# Patient Record
Sex: Female | Born: 1965
Health system: Southern US, Community
[De-identification: ages and names within clinical notes are randomized; demographics above are authoritative.]

## PROBLEM LIST (undated history)

## (undated) DIAGNOSIS — M1990 Unspecified osteoarthritis, unspecified site: Secondary | ICD-10-CM

## (undated) DIAGNOSIS — F32A Depression, unspecified: Secondary | ICD-10-CM

## (undated) HISTORY — PX: COLONOSCOPY: SHX174

---

## 1997-03-13 ENCOUNTER — Encounter: Admission: RE | Admit: 1997-03-13 | Discharge: 1997-06-11 | Payer: Self-pay | Admitting: Obstetrics and Gynecology

## 1998-06-21 ENCOUNTER — Other Ambulatory Visit: Admission: RE | Admit: 1998-06-21 | Discharge: 1998-06-21 | Payer: Self-pay | Admitting: *Deleted

## 1999-01-17 ENCOUNTER — Inpatient Hospital Stay (HOSPITAL_COMMUNITY): Admission: AD | Admit: 1999-01-17 | Discharge: 1999-01-20 | Payer: Self-pay | Admitting: *Deleted

## 1999-02-27 ENCOUNTER — Other Ambulatory Visit: Admission: RE | Admit: 1999-02-27 | Discharge: 1999-02-27 | Payer: Self-pay | Admitting: *Deleted

## 1999-07-10 ENCOUNTER — Encounter: Admission: RE | Admit: 1999-07-10 | Discharge: 1999-07-10 | Payer: Self-pay | Admitting: Gastroenterology

## 1999-07-10 ENCOUNTER — Encounter: Payer: Self-pay | Admitting: Gastroenterology

## 1999-07-12 ENCOUNTER — Encounter: Admission: RE | Admit: 1999-07-12 | Discharge: 1999-07-12 | Payer: Self-pay | Admitting: Gastroenterology

## 1999-07-12 ENCOUNTER — Encounter: Payer: Self-pay | Admitting: Gastroenterology

## 1999-11-21 ENCOUNTER — Ambulatory Visit (HOSPITAL_COMMUNITY): Admission: RE | Admit: 1999-11-21 | Discharge: 1999-11-21 | Payer: Self-pay | Admitting: Gastroenterology

## 2000-05-20 ENCOUNTER — Other Ambulatory Visit: Admission: RE | Admit: 2000-05-20 | Discharge: 2000-05-20 | Payer: Self-pay | Admitting: *Deleted

## 2002-02-16 ENCOUNTER — Other Ambulatory Visit: Admission: RE | Admit: 2002-02-16 | Discharge: 2002-02-16 | Payer: Self-pay | Admitting: *Deleted

## 2003-01-11 ENCOUNTER — Ambulatory Visit (HOSPITAL_COMMUNITY): Admission: RE | Admit: 2003-01-11 | Discharge: 2003-01-11 | Payer: Self-pay | Admitting: *Deleted

## 2003-08-08 ENCOUNTER — Other Ambulatory Visit: Admission: RE | Admit: 2003-08-08 | Discharge: 2003-08-08 | Payer: Self-pay | Admitting: Obstetrics and Gynecology

## 2005-12-17 ENCOUNTER — Ambulatory Visit (HOSPITAL_COMMUNITY): Admission: RE | Admit: 2005-12-17 | Discharge: 2005-12-17 | Payer: Self-pay | Admitting: Gastroenterology

## 2010-04-10 ENCOUNTER — Other Ambulatory Visit: Payer: Self-pay | Admitting: Gastroenterology

## 2011-06-27 ENCOUNTER — Other Ambulatory Visit: Payer: Self-pay | Admitting: Obstetrics and Gynecology

## 2011-06-27 DIAGNOSIS — R928 Other abnormal and inconclusive findings on diagnostic imaging of breast: Secondary | ICD-10-CM

## 2011-07-17 ENCOUNTER — Ambulatory Visit
Admission: RE | Admit: 2011-07-17 | Discharge: 2011-07-17 | Disposition: A | Discharge status: Home / Self Care | Payer: 59 | Source: Ambulatory Visit | Attending: Obstetrics and Gynecology | Admitting: Obstetrics and Gynecology

## 2011-07-17 ENCOUNTER — Other Ambulatory Visit: Payer: Self-pay | Admitting: Obstetrics and Gynecology

## 2011-07-17 DIAGNOSIS — R928 Other abnormal and inconclusive findings on diagnostic imaging of breast: Secondary | ICD-10-CM

## 2011-07-31 ENCOUNTER — Ambulatory Visit
Admission: RE | Admit: 2011-07-31 | Discharge: 2011-07-31 | Disposition: A | Discharge status: Home / Self Care | Payer: 59 | Source: Ambulatory Visit | Attending: Obstetrics and Gynecology | Admitting: Obstetrics and Gynecology

## 2011-07-31 ENCOUNTER — Other Ambulatory Visit: Payer: Self-pay | Admitting: Obstetrics and Gynecology

## 2011-07-31 DIAGNOSIS — R928 Other abnormal and inconclusive findings on diagnostic imaging of breast: Secondary | ICD-10-CM

## 2012-03-13 DEATH — deceased

## 2012-10-06 ENCOUNTER — Other Ambulatory Visit: Payer: Self-pay

## 2012-10-06 DIAGNOSIS — Z1231 Encounter for screening mammogram for malignant neoplasm of breast: Secondary | ICD-10-CM

## 2012-11-03 ENCOUNTER — Ambulatory Visit
Admission: RE | Admit: 2012-11-03 | Discharge: 2012-11-03 | Disposition: A | Discharge status: Home / Self Care | Payer: 59 | Source: Ambulatory Visit

## 2012-11-03 DIAGNOSIS — Z1231 Encounter for screening mammogram for malignant neoplasm of breast: Secondary | ICD-10-CM

## 2012-11-09 ENCOUNTER — Other Ambulatory Visit: Payer: Self-pay | Admitting: Internal Medicine

## 2012-11-09 DIAGNOSIS — R928 Other abnormal and inconclusive findings on diagnostic imaging of breast: Secondary | ICD-10-CM

## 2012-11-24 ENCOUNTER — Ambulatory Visit
Admission: RE | Admit: 2012-11-24 | Discharge: 2012-11-24 | Disposition: A | Discharge status: Home / Self Care | Payer: 59 | Source: Ambulatory Visit | Attending: Internal Medicine | Admitting: Internal Medicine

## 2012-11-24 DIAGNOSIS — R928 Other abnormal and inconclusive findings on diagnostic imaging of breast: Secondary | ICD-10-CM

## 2013-11-10 ENCOUNTER — Other Ambulatory Visit: Payer: Self-pay

## 2013-11-10 DIAGNOSIS — Z1231 Encounter for screening mammogram for malignant neoplasm of breast: Secondary | ICD-10-CM

## 2013-11-14 ENCOUNTER — Ambulatory Visit: Payer: 59

## 2013-11-16 ENCOUNTER — Inpatient Hospital Stay: Admission: RE | Admit: 2013-11-16 | Payer: 59 | Source: Ambulatory Visit

## 2013-11-23 ENCOUNTER — Ambulatory Visit
Admission: RE | Admit: 2013-11-23 | Discharge: 2013-11-23 | Disposition: A | Discharge status: Home / Self Care | Payer: 59 | Source: Ambulatory Visit

## 2013-11-23 DIAGNOSIS — Z1231 Encounter for screening mammogram for malignant neoplasm of breast: Secondary | ICD-10-CM

## 2014-11-01 ENCOUNTER — Other Ambulatory Visit: Payer: Self-pay

## 2014-11-01 DIAGNOSIS — Z1231 Encounter for screening mammogram for malignant neoplasm of breast: Secondary | ICD-10-CM

## 2014-12-08 ENCOUNTER — Ambulatory Visit
Admission: RE | Admit: 2014-12-08 | Discharge: 2014-12-08 | Disposition: A | Discharge status: Home / Self Care | Payer: 59 | Source: Ambulatory Visit

## 2014-12-08 DIAGNOSIS — Z1231 Encounter for screening mammogram for malignant neoplasm of breast: Secondary | ICD-10-CM

## 2015-03-05 MED FILL — ESCITALOPRAM 10 MG TABLET: 10 | 30 days supply | Qty: 30 | Fill #6

## 2015-04-12 MED FILL — ESCITALOPRAM 10 MG TABLET: 10 | 30 days supply | Qty: 30 | Fill #7

## 2015-04-23 DIAGNOSIS — E784 Other hyperlipidemia: Secondary | ICD-10-CM | POA: Diagnosis not present

## 2015-04-23 DIAGNOSIS — Z0000 Encounter for general adult medical examination without abnormal findings: Secondary | ICD-10-CM | POA: Diagnosis not present

## 2015-06-13 DIAGNOSIS — E784 Other hyperlipidemia: Secondary | ICD-10-CM | POA: Diagnosis not present

## 2015-06-13 DIAGNOSIS — I868 Varicose veins of other specified sites: Secondary | ICD-10-CM | POA: Diagnosis not present

## 2015-06-13 DIAGNOSIS — F329 Major depressive disorder, single episode, unspecified: Secondary | ICD-10-CM | POA: Diagnosis not present

## 2015-06-13 DIAGNOSIS — I7300 Raynaud's syndrome without gangrene: Secondary | ICD-10-CM | POA: Diagnosis not present

## 2015-06-13 DIAGNOSIS — D126 Benign neoplasm of colon, unspecified: Secondary | ICD-10-CM | POA: Diagnosis not present

## 2015-06-13 DIAGNOSIS — Z8249 Family history of ischemic heart disease and other diseases of the circulatory system: Secondary | ICD-10-CM | POA: Diagnosis not present

## 2015-06-13 DIAGNOSIS — Z0000 Encounter for general adult medical examination without abnormal findings: Secondary | ICD-10-CM | POA: Diagnosis not present

## 2015-06-13 DIAGNOSIS — R0789 Other chest pain: Secondary | ICD-10-CM | POA: Diagnosis not present

## 2015-06-13 DIAGNOSIS — M25552 Pain in left hip: Secondary | ICD-10-CM | POA: Diagnosis not present

## 2015-06-13 MED FILL — FLUTICASONE PROP 50 MCG SPR: 50 | 90 days supply | Qty: 48 | Fill #0

## 2015-06-13 MED FILL — valACYclovir HCL 1 GM TABS: 1 | 5 days supply | Qty: 20 | Fill #0

## 2015-06-13 MED FILL — ROSUVASTATIN CALCIUM 10 MG: 10 | 90 days supply | Qty: 90 | Fill #0

## 2015-06-14 MED FILL — ESCITALOPRAM 10 MG TABLET: 10 | 30 days supply | Qty: 30 | Fill #0

## 2015-07-27 MED FILL — ESCITALOPRAM 10 MG TABLET: 10 | 90 days supply | Qty: 90 | Fill #0

## 2015-11-16 MED FILL — ESCITALOPRAM 10 MG TABLET: 10 | 90 days supply | Qty: 90 | Fill #1

## 2015-11-16 MED FILL — ROSUVASTATIN CALCIUM 10 MG: 10 | 90 days supply | Qty: 90 | Fill #1

## 2015-11-16 MED FILL — FLUTICASONE PROP 50 MCG SPR: 50 | 90 days supply | Qty: 48 | Fill #1

## 2016-03-20 DIAGNOSIS — Z01419 Encounter for gynecological examination (general) (routine) without abnormal findings: Secondary | ICD-10-CM | POA: Diagnosis not present

## 2016-03-20 DIAGNOSIS — Z6822 Body mass index (BMI) 22.0-22.9, adult: Secondary | ICD-10-CM | POA: Diagnosis not present

## 2016-05-27 MED FILL — valACYclovir HCL 1 GM TABS: 1 | 5 days supply | Qty: 20 | Fill #1

## 2016-05-27 MED FILL — ROSUVASTATIN CALCIUM 10 MG: 10 | 90 days supply | Qty: 90 | Fill #2

## 2016-05-27 MED FILL — ESCITALOPRAM 10 MG TABLET: 10 | 90 days supply | Qty: 90 | Fill #2

## 2016-05-27 MED FILL — FLUTICASONE PROP 50 MCG SPR: 50 | 90 days supply | Qty: 48 | Fill #2

## 2016-06-03 DIAGNOSIS — D1801 Hemangioma of skin and subcutaneous tissue: Secondary | ICD-10-CM | POA: Diagnosis not present

## 2016-06-03 DIAGNOSIS — L814 Other melanin hyperpigmentation: Secondary | ICD-10-CM | POA: Diagnosis not present

## 2016-06-03 DIAGNOSIS — L720 Epidermal cyst: Secondary | ICD-10-CM | POA: Diagnosis not present

## 2016-06-03 DIAGNOSIS — L438 Other lichen planus: Secondary | ICD-10-CM | POA: Diagnosis not present

## 2016-06-03 DIAGNOSIS — L821 Other seborrheic keratosis: Secondary | ICD-10-CM | POA: Diagnosis not present

## 2016-06-27 DIAGNOSIS — Z0000 Encounter for general adult medical examination without abnormal findings: Secondary | ICD-10-CM | POA: Diagnosis not present

## 2016-07-03 DIAGNOSIS — R0789 Other chest pain: Secondary | ICD-10-CM | POA: Diagnosis not present

## 2016-07-03 DIAGNOSIS — E784 Other hyperlipidemia: Secondary | ICD-10-CM | POA: Diagnosis not present

## 2016-07-03 DIAGNOSIS — Z0000 Encounter for general adult medical examination without abnormal findings: Secondary | ICD-10-CM | POA: Diagnosis not present

## 2016-07-03 DIAGNOSIS — N390 Urinary tract infection, site not specified: Secondary | ICD-10-CM | POA: Diagnosis not present

## 2016-07-03 DIAGNOSIS — I7300 Raynaud's syndrome without gangrene: Secondary | ICD-10-CM | POA: Diagnosis not present

## 2016-07-03 DIAGNOSIS — Z23 Encounter for immunization: Secondary | ICD-10-CM | POA: Diagnosis not present

## 2016-07-03 DIAGNOSIS — Z1212 Encounter for screening for malignant neoplasm of rectum: Secondary | ICD-10-CM | POA: Diagnosis not present

## 2016-07-03 DIAGNOSIS — B009 Herpesviral infection, unspecified: Secondary | ICD-10-CM | POA: Diagnosis not present

## 2016-07-03 DIAGNOSIS — R42 Dizziness and giddiness: Secondary | ICD-10-CM | POA: Diagnosis not present

## 2016-07-03 DIAGNOSIS — M25552 Pain in left hip: Secondary | ICD-10-CM | POA: Diagnosis not present

## 2016-07-03 DIAGNOSIS — Z1389 Encounter for screening for other disorder: Secondary | ICD-10-CM | POA: Diagnosis not present

## 2016-07-03 DIAGNOSIS — I868 Varicose veins of other specified sites: Secondary | ICD-10-CM | POA: Diagnosis not present

## 2016-09-17 DIAGNOSIS — R300 Dysuria: Secondary | ICD-10-CM | POA: Diagnosis not present

## 2016-09-17 DIAGNOSIS — N390 Urinary tract infection, site not specified: Secondary | ICD-10-CM | POA: Diagnosis not present

## 2016-09-17 MED FILL — CEFDINIR 300 MG CAPSULE: 300 | 7 days supply | Qty: 14 | Fill #0

## 2016-10-28 MED FILL — FLUTICASONE PROP 50 MCG SPR: 50 | 90 days supply | Qty: 48 | Fill #0

## 2016-10-28 MED FILL — ESCITALOPRAM 10 MG TABLET: 10 | 90 days supply | Qty: 90 | Fill #0

## 2016-10-28 MED FILL — ROSUVASTATIN CALCIUM 10 MG: 10 | 90 days supply | Qty: 90 | Fill #0

## 2016-12-24 DIAGNOSIS — H524 Presbyopia: Secondary | ICD-10-CM | POA: Diagnosis not present

## 2017-07-07 MED FILL — TRIAMCINOLONE 0.1% CREAM: 0.1 | 15 days supply | Qty: 80 | Fill #0

## 2017-07-20 ENCOUNTER — Other Ambulatory Visit: Payer: Self-pay | Admitting: Internal Medicine

## 2017-07-20 DIAGNOSIS — Z1231 Encounter for screening mammogram for malignant neoplasm of breast: Secondary | ICD-10-CM

## 2017-08-10 ENCOUNTER — Ambulatory Visit: Payer: 59

## 2017-08-10 ENCOUNTER — Ambulatory Visit
Admission: RE | Admit: 2017-08-10 | Discharge: 2017-08-10 | Disposition: A | Discharge status: Home / Self Care | Payer: 59 | Source: Ambulatory Visit | Attending: Internal Medicine | Admitting: Internal Medicine

## 2017-08-10 DIAGNOSIS — Z1231 Encounter for screening mammogram for malignant neoplasm of breast: Secondary | ICD-10-CM | POA: Diagnosis not present

## 2017-09-16 MED FILL — SHINGRIX 50 MCG SUS: 50 | 1 days supply | Qty: 1 | Fill #0

## 2017-10-05 DIAGNOSIS — Z86010 Personal history of colonic polyps: Secondary | ICD-10-CM | POA: Diagnosis not present

## 2017-10-05 DIAGNOSIS — R140 Abdominal distension (gaseous): Secondary | ICD-10-CM | POA: Diagnosis not present

## 2017-10-05 DIAGNOSIS — Z800 Family history of malignant neoplasm of digestive organs: Secondary | ICD-10-CM | POA: Diagnosis not present

## 2017-10-14 DIAGNOSIS — Z01419 Encounter for gynecological examination (general) (routine) without abnormal findings: Secondary | ICD-10-CM | POA: Diagnosis not present

## 2017-10-14 DIAGNOSIS — Z6822 Body mass index (BMI) 22.0-22.9, adult: Secondary | ICD-10-CM | POA: Diagnosis not present

## 2017-11-11 MED FILL — PEG-3350 SOLUTION: 420 | 1 days supply | Qty: 4000 | Fill #0

## 2017-11-13 DIAGNOSIS — Z86010 Personal history of colonic polyps: Secondary | ICD-10-CM | POA: Diagnosis not present

## 2017-11-13 DIAGNOSIS — Z800 Family history of malignant neoplasm of digestive organs: Secondary | ICD-10-CM | POA: Diagnosis not present

## 2017-12-10 MED FILL — SHINGRIX 50 MCG SUS: 50 | 1 days supply | Qty: 1 | Fill #1

## 2018-02-18 DIAGNOSIS — Z0000 Encounter for general adult medical examination without abnormal findings: Secondary | ICD-10-CM | POA: Diagnosis not present

## 2018-02-18 DIAGNOSIS — E7849 Other hyperlipidemia: Secondary | ICD-10-CM | POA: Diagnosis not present

## 2018-02-18 DIAGNOSIS — R82998 Other abnormal findings in urine: Secondary | ICD-10-CM | POA: Diagnosis not present

## 2018-02-24 ENCOUNTER — Other Ambulatory Visit: Payer: Self-pay | Admitting: Internal Medicine

## 2018-02-24 DIAGNOSIS — M25552 Pain in left hip: Secondary | ICD-10-CM | POA: Diagnosis not present

## 2018-02-24 DIAGNOSIS — N951 Menopausal and female climacteric states: Secondary | ICD-10-CM | POA: Diagnosis not present

## 2018-02-24 DIAGNOSIS — Z1389 Encounter for screening for other disorder: Secondary | ICD-10-CM | POA: Diagnosis not present

## 2018-02-24 DIAGNOSIS — E7849 Other hyperlipidemia: Secondary | ICD-10-CM | POA: Diagnosis not present

## 2018-02-24 DIAGNOSIS — Z1231 Encounter for screening mammogram for malignant neoplasm of breast: Secondary | ICD-10-CM

## 2018-02-24 DIAGNOSIS — I7300 Raynaud's syndrome without gangrene: Secondary | ICD-10-CM | POA: Diagnosis not present

## 2018-02-24 DIAGNOSIS — Z0000 Encounter for general adult medical examination without abnormal findings: Secondary | ICD-10-CM | POA: Diagnosis not present

## 2018-02-24 DIAGNOSIS — B009 Herpesviral infection, unspecified: Secondary | ICD-10-CM | POA: Diagnosis not present

## 2018-02-24 DIAGNOSIS — R200 Anesthesia of skin: Secondary | ICD-10-CM | POA: Diagnosis not present

## 2018-02-24 DIAGNOSIS — I868 Varicose veins of other specified sites: Secondary | ICD-10-CM | POA: Diagnosis not present

## 2018-02-24 DIAGNOSIS — D126 Benign neoplasm of colon, unspecified: Secondary | ICD-10-CM | POA: Diagnosis not present

## 2018-02-24 DIAGNOSIS — Z1331 Encounter for screening for depression: Secondary | ICD-10-CM | POA: Diagnosis not present

## 2018-03-01 ENCOUNTER — Other Ambulatory Visit: Payer: Self-pay | Admitting: Internal Medicine

## 2018-03-01 DIAGNOSIS — E785 Hyperlipidemia, unspecified: Secondary | ICD-10-CM

## 2018-03-25 DIAGNOSIS — H5213 Myopia, bilateral: Secondary | ICD-10-CM | POA: Diagnosis not present

## 2018-04-22 MED FILL — valACYclovir HCL 1 GM TABS: 1 | 7 days supply | Qty: 20 | Fill #0

## 2018-08-16 ENCOUNTER — Other Ambulatory Visit: Payer: Self-pay | Admitting: Internal Medicine

## 2018-08-16 DIAGNOSIS — E785 Hyperlipidemia, unspecified: Secondary | ICD-10-CM

## 2018-08-17 ENCOUNTER — Ambulatory Visit
Admission: RE | Admit: 2018-08-17 | Discharge: 2018-08-17 | Disposition: A | Discharge status: Home / Self Care | Payer: 59 | Source: Ambulatory Visit | Attending: Internal Medicine | Admitting: Internal Medicine

## 2018-08-17 DIAGNOSIS — E785 Hyperlipidemia, unspecified: Secondary | ICD-10-CM

## 2018-08-19 MED FILL — EZETIMIBE 10 MG TABS: 10 | 90 days supply | Qty: 90 | Fill #0

## 2018-08-23 ENCOUNTER — Other Ambulatory Visit: Payer: Self-pay

## 2018-08-23 ENCOUNTER — Ambulatory Visit
Admission: RE | Admit: 2018-08-23 | Discharge: 2018-08-23 | Disposition: A | Discharge status: Home / Self Care | Payer: 59 | Source: Ambulatory Visit | Attending: Internal Medicine | Admitting: Internal Medicine

## 2018-08-23 DIAGNOSIS — Z1231 Encounter for screening mammogram for malignant neoplasm of breast: Secondary | ICD-10-CM

## 2018-11-22 MED FILL — EZETIMIBE 10 MG TABS: 10 | 90 days supply | Qty: 90 | Fill #1

## 2019-01-11 DIAGNOSIS — Z01419 Encounter for gynecological examination (general) (routine) without abnormal findings: Secondary | ICD-10-CM | POA: Diagnosis not present

## 2019-01-11 DIAGNOSIS — Z6822 Body mass index (BMI) 22.0-22.9, adult: Secondary | ICD-10-CM | POA: Diagnosis not present

## 2019-02-02 MED FILL — BUPROPION HCL XL 150 MG TAB: 150 | 30 days supply | Qty: 30 | Fill #0

## 2019-02-02 MED FILL — ESCITALOPRAM 10 MG TABLET: 10 | 90 days supply | Qty: 90 | Fill #0

## 2019-02-02 MED FILL — INTRAROSA 6.5 MG VAG INSERT: 6.5 | 28 days supply | Qty: 28 | Fill #0

## 2019-03-24 MED FILL — buPROPion HCL ER (XL) 150 M: 150 | 90 days supply | Qty: 90 | Fill #0

## 2019-03-24 MED FILL — ESCITALOPRAM 10 MG TABLET: 10 | 90 days supply | Qty: 90 | Fill #0

## 2019-03-28 DIAGNOSIS — Z0000 Encounter for general adult medical examination without abnormal findings: Secondary | ICD-10-CM | POA: Diagnosis not present

## 2019-03-28 DIAGNOSIS — E7849 Other hyperlipidemia: Secondary | ICD-10-CM | POA: Diagnosis not present

## 2019-03-31 MED FILL — EZETIMIBE 10 MG TABS: 10 | 90 days supply | Qty: 90 | Fill #0

## 2019-04-04 DIAGNOSIS — Z1331 Encounter for screening for depression: Secondary | ICD-10-CM | POA: Diagnosis not present

## 2019-04-04 DIAGNOSIS — E785 Hyperlipidemia, unspecified: Secondary | ICD-10-CM | POA: Diagnosis not present

## 2019-04-04 DIAGNOSIS — D126 Benign neoplasm of colon, unspecified: Secondary | ICD-10-CM | POA: Diagnosis not present

## 2019-04-04 DIAGNOSIS — I7300 Raynaud's syndrome without gangrene: Secondary | ICD-10-CM | POA: Diagnosis not present

## 2019-04-04 DIAGNOSIS — Z8249 Family history of ischemic heart disease and other diseases of the circulatory system: Secondary | ICD-10-CM | POA: Diagnosis not present

## 2019-04-04 DIAGNOSIS — F329 Major depressive disorder, single episode, unspecified: Secondary | ICD-10-CM | POA: Diagnosis not present

## 2019-04-04 DIAGNOSIS — M25552 Pain in left hip: Secondary | ICD-10-CM | POA: Diagnosis not present

## 2019-04-04 DIAGNOSIS — N951 Menopausal and female climacteric states: Secondary | ICD-10-CM | POA: Diagnosis not present

## 2019-04-04 DIAGNOSIS — Z0000 Encounter for general adult medical examination without abnormal findings: Secondary | ICD-10-CM | POA: Diagnosis not present

## 2019-04-04 DIAGNOSIS — I700 Atherosclerosis of aorta: Secondary | ICD-10-CM | POA: Diagnosis not present

## 2019-04-04 MED FILL — valACYclovir HCL 1 GM TABS: 1 | 7 days supply | Qty: 20 | Fill #1

## 2019-04-08 MED FILL — EZETIMIBE 10 MG TABS: 10 | 90 days supply | Qty: 90 | Fill #0

## 2019-04-08 MED FILL — buPROPion HCL ER (XL) 150 M: 150 | 90 days supply | Qty: 90 | Fill #0

## 2019-04-08 MED FILL — ESCITALOPRAM 10 MG TABLET: 10 | 90 days supply | Qty: 90 | Fill #0

## 2019-04-11 DIAGNOSIS — M25552 Pain in left hip: Secondary | ICD-10-CM | POA: Diagnosis not present

## 2019-04-11 DIAGNOSIS — M545 Low back pain: Secondary | ICD-10-CM | POA: Diagnosis not present

## 2019-04-11 MED FILL — MELOXICAM 15 MG TABLET: 15 | 30 days supply | Qty: 30 | Fill #0

## 2019-04-22 DIAGNOSIS — L821 Other seborrheic keratosis: Secondary | ICD-10-CM | POA: Diagnosis not present

## 2019-04-22 DIAGNOSIS — D225 Melanocytic nevi of trunk: Secondary | ICD-10-CM | POA: Diagnosis not present

## 2019-04-22 DIAGNOSIS — D2272 Melanocytic nevi of left lower limb, including hip: Secondary | ICD-10-CM | POA: Diagnosis not present

## 2019-04-22 DIAGNOSIS — D170 Benign lipomatous neoplasm of skin and subcutaneous tissue of head, face and neck: Secondary | ICD-10-CM | POA: Diagnosis not present

## 2019-04-22 DIAGNOSIS — L814 Other melanin hyperpigmentation: Secondary | ICD-10-CM | POA: Diagnosis not present

## 2019-04-22 DIAGNOSIS — L720 Epidermal cyst: Secondary | ICD-10-CM | POA: Diagnosis not present

## 2019-04-22 DIAGNOSIS — D485 Neoplasm of uncertain behavior of skin: Secondary | ICD-10-CM | POA: Diagnosis not present

## 2019-04-22 DIAGNOSIS — D1800 Hemangioma unspecified site: Secondary | ICD-10-CM | POA: Diagnosis not present

## 2019-04-22 DIAGNOSIS — D1801 Hemangioma of skin and subcutaneous tissue: Secondary | ICD-10-CM | POA: Diagnosis not present

## 2019-05-03 DIAGNOSIS — H524 Presbyopia: Secondary | ICD-10-CM | POA: Diagnosis not present

## 2019-05-20 DIAGNOSIS — M25552 Pain in left hip: Secondary | ICD-10-CM | POA: Diagnosis not present

## 2019-05-20 DIAGNOSIS — M545 Low back pain: Secondary | ICD-10-CM | POA: Diagnosis not present

## 2019-06-09 MED FILL — MELOXICAM 15 MG TABLET: 15 | 30 days supply | Qty: 30 | Fill #1

## 2019-07-15 DIAGNOSIS — M25552 Pain in left hip: Secondary | ICD-10-CM | POA: Diagnosis not present

## 2019-07-15 MED FILL — EZETIMIBE 10 MG TABS: 10 | 90 days supply | Qty: 90 | Fill #1

## 2019-11-04 DIAGNOSIS — M1612 Unilateral primary osteoarthritis, left hip: Secondary | ICD-10-CM | POA: Diagnosis not present

## 2019-12-14 ENCOUNTER — Other Ambulatory Visit (HOSPITAL_COMMUNITY): Payer: Self-pay | Admitting: Internal Medicine

## 2019-12-14 MED FILL — MELOXICAM 15 MG TABLET: 15 | 30 days supply | Qty: 30 | Fill #2

## 2019-12-15 MED FILL — EZETIMIBE 10 MG TABS: 10 | 90 days supply | Qty: 90 | Fill #0

## 2019-12-19 ENCOUNTER — Other Ambulatory Visit (HOSPITAL_COMMUNITY): Payer: Self-pay | Admitting: Radiology

## 2019-12-19 MED FILL — ESCITALOPRAM 10 MG TABLET: 10 | 90 days supply | Qty: 90 | Fill #0

## 2020-03-13 ENCOUNTER — Other Ambulatory Visit (HOSPITAL_COMMUNITY): Payer: Self-pay | Admitting: Obstetrics and Gynecology

## 2020-03-13 DIAGNOSIS — R6882 Decreased libido: Secondary | ICD-10-CM | POA: Diagnosis not present

## 2020-03-13 DIAGNOSIS — Z6821 Body mass index (BMI) 21.0-21.9, adult: Secondary | ICD-10-CM | POA: Diagnosis not present

## 2020-03-13 DIAGNOSIS — Z01419 Encounter for gynecological examination (general) (routine) without abnormal findings: Secondary | ICD-10-CM | POA: Diagnosis not present

## 2020-03-13 DIAGNOSIS — F32A Depression, unspecified: Secondary | ICD-10-CM | POA: Diagnosis not present

## 2020-03-13 DIAGNOSIS — Z800 Family history of malignant neoplasm of digestive organs: Secondary | ICD-10-CM | POA: Diagnosis not present

## 2020-03-13 MED FILL — buPROPion HCL ER (XL) 150 M: 150 | 90 days supply | Qty: 90 | Fill #0

## 2020-04-23 DIAGNOSIS — E785 Hyperlipidemia, unspecified: Secondary | ICD-10-CM | POA: Diagnosis not present

## 2020-04-23 DIAGNOSIS — Z0000 Encounter for general adult medical examination without abnormal findings: Secondary | ICD-10-CM | POA: Diagnosis not present

## 2020-04-30 MED FILL — EZETIMIBE 10 MG TABS: 10 | 90 days supply | Qty: 90 | Fill #1

## 2020-05-01 DIAGNOSIS — I700 Atherosclerosis of aorta: Secondary | ICD-10-CM | POA: Diagnosis not present

## 2020-05-01 DIAGNOSIS — F329 Major depressive disorder, single episode, unspecified: Secondary | ICD-10-CM | POA: Diagnosis not present

## 2020-05-01 DIAGNOSIS — Z8249 Family history of ischemic heart disease and other diseases of the circulatory system: Secondary | ICD-10-CM | POA: Diagnosis not present

## 2020-05-01 DIAGNOSIS — Z0000 Encounter for general adult medical examination without abnormal findings: Secondary | ICD-10-CM | POA: Diagnosis not present

## 2020-05-01 DIAGNOSIS — E785 Hyperlipidemia, unspecified: Secondary | ICD-10-CM | POA: Diagnosis not present

## 2020-05-01 DIAGNOSIS — M25552 Pain in left hip: Secondary | ICD-10-CM | POA: Diagnosis not present

## 2020-05-14 DIAGNOSIS — Z13820 Encounter for screening for osteoporosis: Secondary | ICD-10-CM | POA: Diagnosis not present

## 2020-05-14 DIAGNOSIS — Z1231 Encounter for screening mammogram for malignant neoplasm of breast: Secondary | ICD-10-CM | POA: Diagnosis not present

## 2020-06-21 ENCOUNTER — Other Ambulatory Visit (HOSPITAL_COMMUNITY): Payer: Self-pay

## 2020-06-21 MED ORDER — ESCITALOPRAM OXALATE 10 MG PO TABS
10.0000 mg | ORAL_TABLET | Freq: Every day | ORAL | 3 refills | Status: DC
  Filled 2020-06-21 – 2020-07-13 (×2): qty 90, 90d supply, fill #0
  Filled 2020-12-04: qty 90, 90d supply, fill #1
  Filled 2021-05-03 – 2021-05-14 (×2): qty 90, 90d supply, fill #2

## 2020-06-29 ENCOUNTER — Other Ambulatory Visit (HOSPITAL_COMMUNITY): Payer: Self-pay

## 2020-07-12 ENCOUNTER — Other Ambulatory Visit (HOSPITAL_COMMUNITY): Payer: Self-pay

## 2020-07-12 MED ORDER — CARESTART COVID-19 HOME TEST VI KIT
PACK | 0 refills | Status: DC
  Filled 2020-07-12: qty 4, 4d supply, fill #0

## 2020-07-13 ENCOUNTER — Other Ambulatory Visit (HOSPITAL_COMMUNITY): Payer: Self-pay

## 2020-07-27 ENCOUNTER — Other Ambulatory Visit (HOSPITAL_COMMUNITY): Payer: Self-pay

## 2020-07-27 MED FILL — Bupropion HCl Tab ER 24HR 150 MG: ORAL | 90 days supply | Qty: 90 | Fill #0 | Status: AC

## 2020-09-10 ENCOUNTER — Other Ambulatory Visit (HOSPITAL_COMMUNITY): Payer: Self-pay

## 2020-09-10 MED FILL — Ezetimibe Tab 10 MG: ORAL | 90 days supply | Qty: 90 | Fill #0 | Status: AC

## 2020-12-04 ENCOUNTER — Other Ambulatory Visit (HOSPITAL_COMMUNITY): Payer: Self-pay

## 2020-12-04 MED ORDER — VALACYCLOVIR HCL 1 G PO TABS
2000.0000 mg | ORAL_TABLET | ORAL | 0 refills | Status: DC
  Filled 2020-12-04: qty 20, 5d supply, fill #0

## 2020-12-04 MED ORDER — EZETIMIBE 10 MG PO TABS
10.0000 mg | ORAL_TABLET | Freq: Every day | ORAL | 3 refills | Status: DC
  Filled 2020-12-04: qty 90, 90d supply, fill #0
  Filled 2021-05-03 – 2021-05-14 (×2): qty 90, 90d supply, fill #1
  Filled 2021-11-15: qty 90, 90d supply, fill #2

## 2020-12-04 MED FILL — Bupropion HCl Tab ER 24HR 150 MG: ORAL | 90 days supply | Qty: 90 | Fill #1 | Status: AC

## 2021-04-02 IMAGING — MG DIGITAL SCREENING BILATERAL MAMMOGRAM WITH TOMO AND CAD
8 series · 8 of 24 positions shown · non-contrast
Comparison: Previous exam(s).

CLINICAL DATA: Screening.

EXAM:
DIGITAL SCREENING BILATERAL MAMMOGRAM WITH TOMO AND CAD

[R MLO synth-2D]
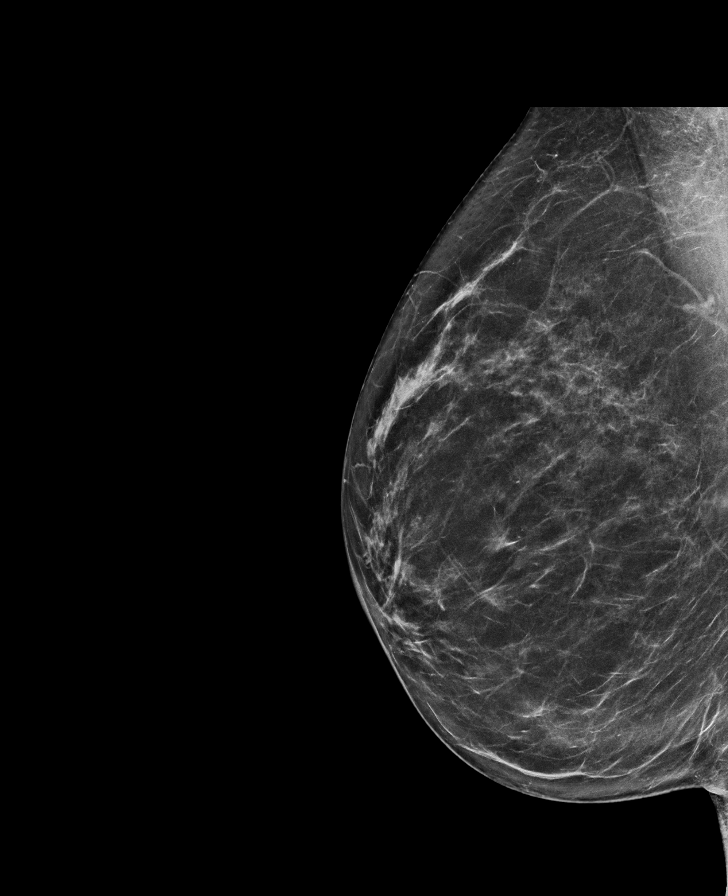

[L CC synth-2D]
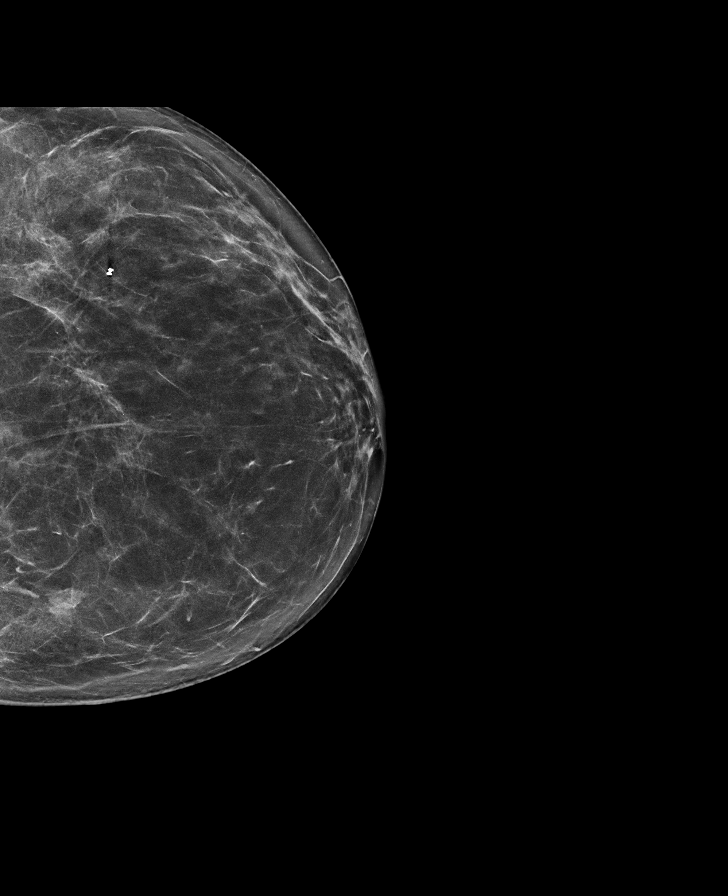

[L MLO synth-2D]
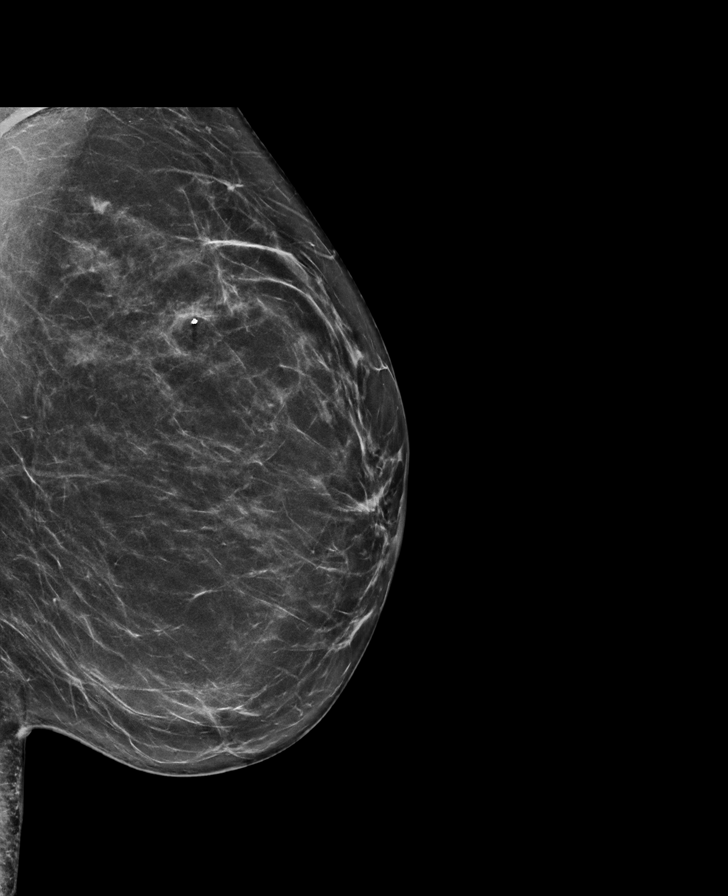

[R CC synth-2D]
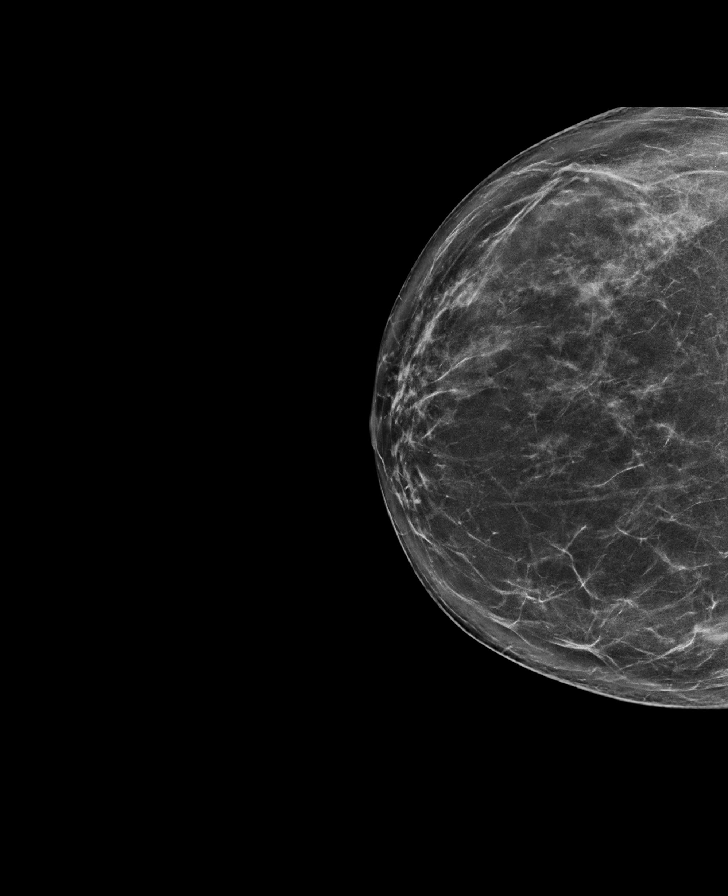

[R CC tomo · tomo slice 39/78.0]
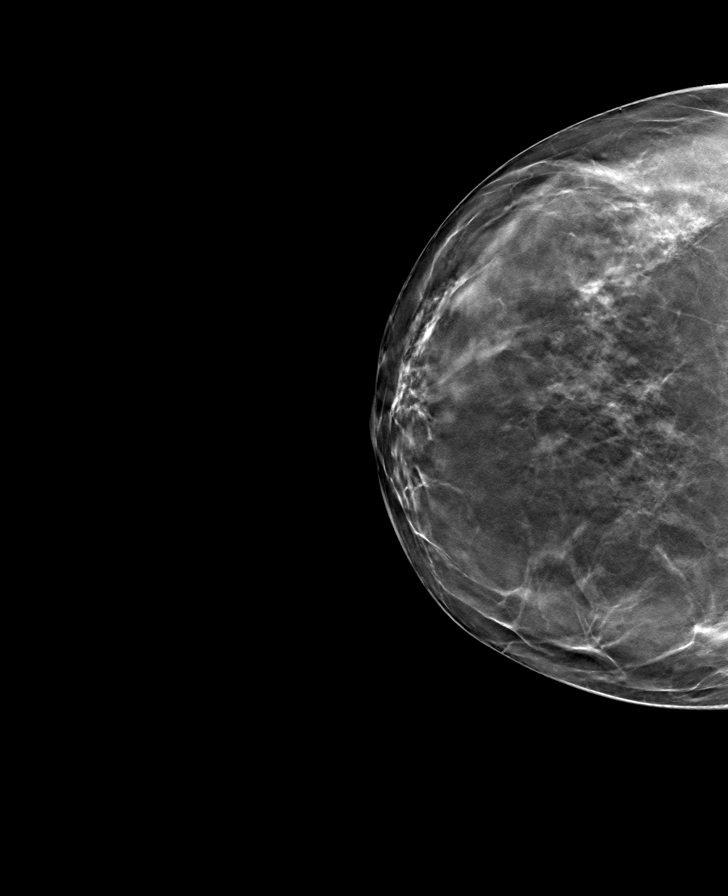

[L CC tomo · tomo slice 39/78.0]
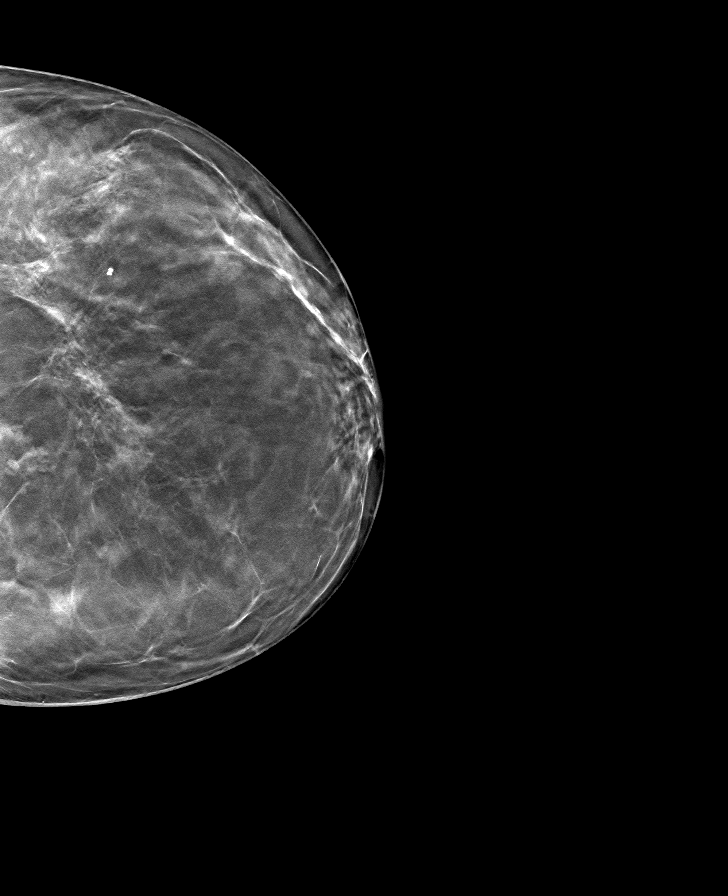

[L MLO tomo · tomo slice 37/72.0]
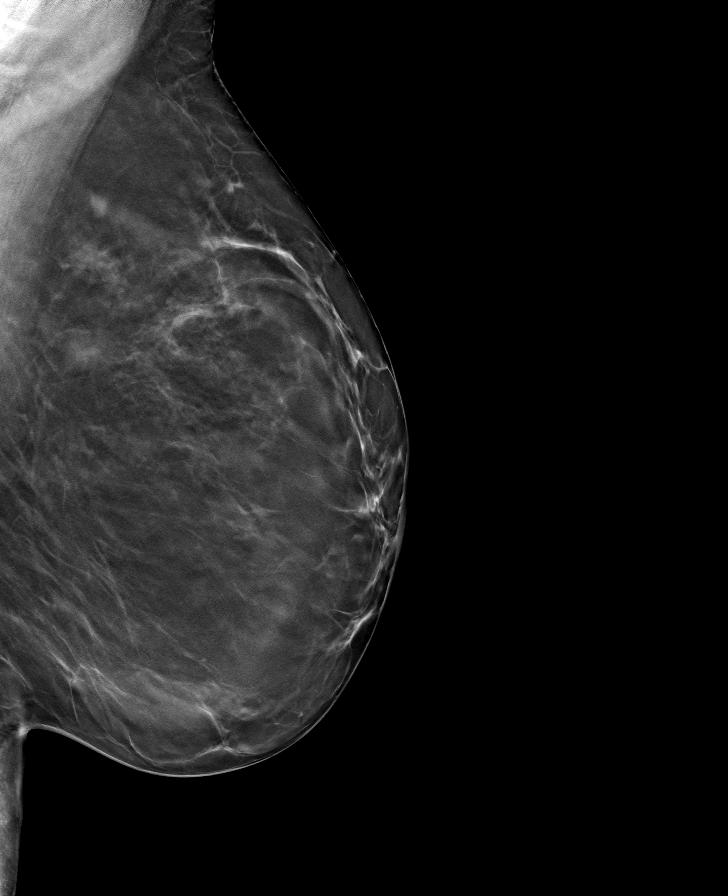

[R MLO tomo · tomo slice 35/69.0]
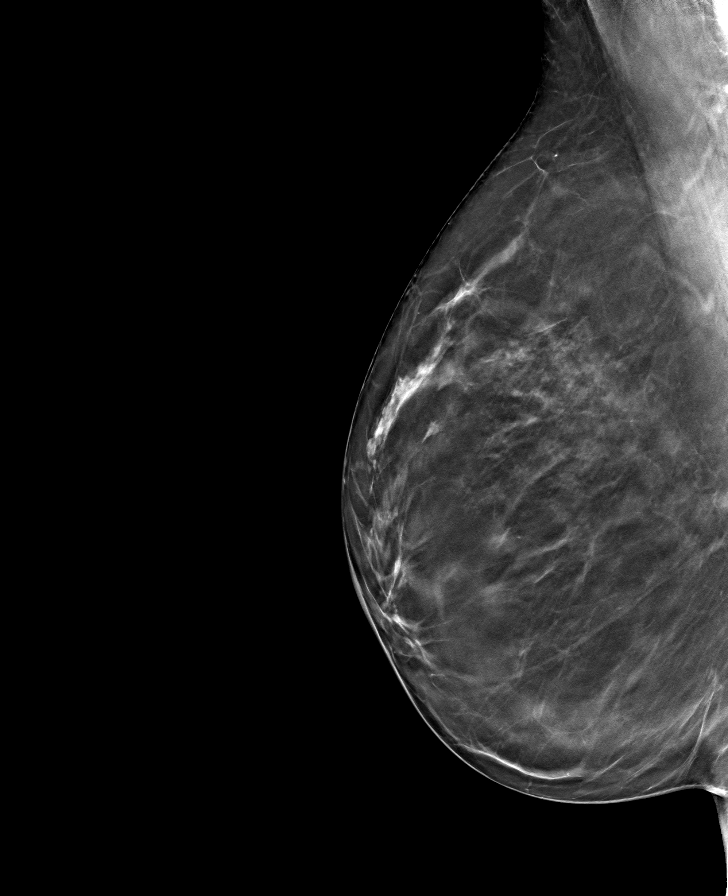

[8 of 24 positions shown; findings below may reference images not displayed]

ACR Breast Density Category b: There are scattered areas of
fibroglandular density.
FINDINGS: There are no findings suspicious for malignancy. Images were
processed with CAD.
IMPRESSION: No mammographic evidence of malignancy. A result letter of this
screening mammogram will be mailed directly to the patient.

RECOMMENDATION:
Screening mammogram in one year. (Code:[id number])

BI-RADS CATEGORY  1: Negative.

## 2021-05-03 ENCOUNTER — Other Ambulatory Visit (HOSPITAL_COMMUNITY): Payer: Self-pay

## 2021-05-13 ENCOUNTER — Other Ambulatory Visit (HOSPITAL_COMMUNITY): Payer: Self-pay

## 2021-05-14 ENCOUNTER — Other Ambulatory Visit (HOSPITAL_COMMUNITY): Payer: Self-pay

## 2021-06-12 DIAGNOSIS — E785 Hyperlipidemia, unspecified: Secondary | ICD-10-CM | POA: Diagnosis not present

## 2021-06-12 DIAGNOSIS — Z0000 Encounter for general adult medical examination without abnormal findings: Secondary | ICD-10-CM | POA: Diagnosis not present

## 2021-06-12 DIAGNOSIS — R7989 Other specified abnormal findings of blood chemistry: Secondary | ICD-10-CM | POA: Diagnosis not present

## 2021-06-20 ENCOUNTER — Other Ambulatory Visit (HOSPITAL_COMMUNITY): Payer: Self-pay

## 2021-06-20 DIAGNOSIS — I700 Atherosclerosis of aorta: Secondary | ICD-10-CM | POA: Diagnosis not present

## 2021-06-20 DIAGNOSIS — Z1389 Encounter for screening for other disorder: Secondary | ICD-10-CM | POA: Diagnosis not present

## 2021-06-20 DIAGNOSIS — R82998 Other abnormal findings in urine: Secondary | ICD-10-CM | POA: Diagnosis not present

## 2021-06-20 DIAGNOSIS — B009 Herpesviral infection, unspecified: Secondary | ICD-10-CM | POA: Diagnosis not present

## 2021-06-20 DIAGNOSIS — E785 Hyperlipidemia, unspecified: Secondary | ICD-10-CM | POA: Diagnosis not present

## 2021-06-20 DIAGNOSIS — F329 Major depressive disorder, single episode, unspecified: Secondary | ICD-10-CM | POA: Diagnosis not present

## 2021-06-20 DIAGNOSIS — M25552 Pain in left hip: Secondary | ICD-10-CM | POA: Diagnosis not present

## 2021-06-20 DIAGNOSIS — M8580 Other specified disorders of bone density and structure, unspecified site: Secondary | ICD-10-CM | POA: Diagnosis not present

## 2021-06-20 DIAGNOSIS — D126 Benign neoplasm of colon, unspecified: Secondary | ICD-10-CM | POA: Diagnosis not present

## 2021-06-20 DIAGNOSIS — Z1331 Encounter for screening for depression: Secondary | ICD-10-CM | POA: Diagnosis not present

## 2021-06-20 DIAGNOSIS — N390 Urinary tract infection, site not specified: Secondary | ICD-10-CM | POA: Diagnosis not present

## 2021-06-20 DIAGNOSIS — Z0000 Encounter for general adult medical examination without abnormal findings: Secondary | ICD-10-CM | POA: Diagnosis not present

## 2021-06-20 DIAGNOSIS — Z23 Encounter for immunization: Secondary | ICD-10-CM | POA: Diagnosis not present

## 2021-06-20 MED ORDER — BUPROPION HCL ER (SR) 150 MG PO TB12
150.0000 mg | ORAL_TABLET | Freq: Every morning | ORAL | 3 refills | Status: DC
  Filled 2021-06-20: qty 90, 90d supply, fill #0

## 2021-06-20 MED ORDER — VALACYCLOVIR HCL 1 G PO TABS
2000.0000 mg | ORAL_TABLET | ORAL | 5 refills | Status: AC
  Filled 2021-06-20: qty 30, 7d supply, fill #0

## 2021-06-20 MED ORDER — ROSUVASTATIN CALCIUM 5 MG PO TABS
5.0000 mg | ORAL_TABLET | Freq: Every day | ORAL | 3 refills | Status: DC
  Filled 2021-06-20: qty 90, 90d supply, fill #0
  Filled 2021-11-15: qty 90, 90d supply, fill #1
  Filled 2022-02-04: qty 90, 90d supply, fill #2

## 2021-06-21 ENCOUNTER — Other Ambulatory Visit (HOSPITAL_COMMUNITY): Payer: Self-pay

## 2021-07-24 DIAGNOSIS — Z01419 Encounter for gynecological examination (general) (routine) without abnormal findings: Secondary | ICD-10-CM | POA: Diagnosis not present

## 2021-07-24 DIAGNOSIS — Z1231 Encounter for screening mammogram for malignant neoplasm of breast: Secondary | ICD-10-CM | POA: Diagnosis not present

## 2021-07-24 DIAGNOSIS — M810 Age-related osteoporosis without current pathological fracture: Secondary | ICD-10-CM | POA: Diagnosis not present

## 2021-07-24 DIAGNOSIS — Z6822 Body mass index (BMI) 22.0-22.9, adult: Secondary | ICD-10-CM | POA: Diagnosis not present

## 2021-08-15 DIAGNOSIS — M25552 Pain in left hip: Secondary | ICD-10-CM | POA: Diagnosis not present

## 2021-09-11 DIAGNOSIS — M25552 Pain in left hip: Secondary | ICD-10-CM | POA: Diagnosis not present

## 2021-09-11 DIAGNOSIS — H5213 Myopia, bilateral: Secondary | ICD-10-CM | POA: Diagnosis not present

## 2021-09-16 ENCOUNTER — Other Ambulatory Visit (HOSPITAL_COMMUNITY): Payer: Self-pay

## 2021-09-17 ENCOUNTER — Other Ambulatory Visit (HOSPITAL_COMMUNITY): Payer: Self-pay

## 2021-09-17 MED ORDER — ESCITALOPRAM OXALATE 10 MG PO TABS
10.0000 mg | ORAL_TABLET | Freq: Every day | ORAL | 3 refills | Status: DC
  Filled 2021-09-17: qty 90, 90d supply, fill #0
  Filled 2021-11-15 – 2022-02-04 (×2): qty 90, 90d supply, fill #1
  Filled 2022-07-09: qty 90, 90d supply, fill #2

## 2021-11-15 ENCOUNTER — Other Ambulatory Visit (HOSPITAL_COMMUNITY): Payer: Self-pay

## 2022-01-08 DIAGNOSIS — M25552 Pain in left hip: Secondary | ICD-10-CM | POA: Diagnosis not present

## 2022-02-04 ENCOUNTER — Other Ambulatory Visit (HOSPITAL_COMMUNITY): Payer: Self-pay

## 2022-02-04 MED ORDER — EZETIMIBE 10 MG PO TABS
10.0000 mg | ORAL_TABLET | Freq: Every day | ORAL | 3 refills | Status: DC
  Filled 2022-02-04: qty 90, 90d supply, fill #0
  Filled 2022-07-11: qty 90, 90d supply, fill #1
  Filled 2022-11-12 (×2): qty 90, 90d supply, fill #2

## 2022-02-05 ENCOUNTER — Other Ambulatory Visit: Payer: Self-pay

## 2022-05-08 DIAGNOSIS — M1612 Unilateral primary osteoarthritis, left hip: Secondary | ICD-10-CM | POA: Diagnosis not present

## 2022-07-10 ENCOUNTER — Other Ambulatory Visit: Payer: Self-pay

## 2022-07-11 ENCOUNTER — Other Ambulatory Visit (HOSPITAL_COMMUNITY): Payer: Self-pay

## 2022-07-11 ENCOUNTER — Other Ambulatory Visit: Payer: Self-pay

## 2022-07-14 ENCOUNTER — Other Ambulatory Visit (HOSPITAL_COMMUNITY): Payer: Self-pay

## 2022-07-14 MED ORDER — VALACYCLOVIR HCL 1 G PO TABS
2000.0000 mg | ORAL_TABLET | Freq: Two times a day (BID) | ORAL | 5 refills | Status: AC
Start: 2022-07-14 — End: ?
  Filled 2022-07-14: qty 30, 8d supply, fill #0
  Filled 2022-11-12 (×2): qty 30, 8d supply, fill #1
  Filled 2023-04-16: qty 30, 8d supply, fill #2

## 2022-07-14 MED ORDER — ROSUVASTATIN CALCIUM 5 MG PO TABS
5.0000 mg | ORAL_TABLET | Freq: Every day | ORAL | 3 refills | Status: DC
  Filled 2022-07-14: qty 90, 90d supply, fill #0

## 2022-07-18 ENCOUNTER — Other Ambulatory Visit (HOSPITAL_COMMUNITY): Payer: Self-pay

## 2022-07-21 DIAGNOSIS — E785 Hyperlipidemia, unspecified: Secondary | ICD-10-CM | POA: Diagnosis not present

## 2022-07-21 DIAGNOSIS — K5900 Constipation, unspecified: Secondary | ICD-10-CM | POA: Diagnosis not present

## 2022-07-21 DIAGNOSIS — R7989 Other specified abnormal findings of blood chemistry: Secondary | ICD-10-CM | POA: Diagnosis not present

## 2022-07-28 ENCOUNTER — Other Ambulatory Visit (HOSPITAL_COMMUNITY): Payer: Self-pay

## 2022-07-28 DIAGNOSIS — E785 Hyperlipidemia, unspecified: Secondary | ICD-10-CM | POA: Diagnosis not present

## 2022-07-28 DIAGNOSIS — Z1212 Encounter for screening for malignant neoplasm of rectum: Secondary | ICD-10-CM | POA: Diagnosis not present

## 2022-07-28 DIAGNOSIS — M25552 Pain in left hip: Secondary | ICD-10-CM | POA: Diagnosis not present

## 2022-07-28 DIAGNOSIS — I7300 Raynaud's syndrome without gangrene: Secondary | ICD-10-CM | POA: Diagnosis not present

## 2022-07-28 DIAGNOSIS — Z0000 Encounter for general adult medical examination without abnormal findings: Secondary | ICD-10-CM | POA: Diagnosis not present

## 2022-07-28 DIAGNOSIS — I700 Atherosclerosis of aorta: Secondary | ICD-10-CM | POA: Diagnosis not present

## 2022-07-28 DIAGNOSIS — Z8249 Family history of ischemic heart disease and other diseases of the circulatory system: Secondary | ICD-10-CM | POA: Diagnosis not present

## 2022-07-28 DIAGNOSIS — M8580 Other specified disorders of bone density and structure, unspecified site: Secondary | ICD-10-CM | POA: Diagnosis not present

## 2022-07-28 DIAGNOSIS — B009 Herpesviral infection, unspecified: Secondary | ICD-10-CM | POA: Diagnosis not present

## 2022-07-28 DIAGNOSIS — D126 Benign neoplasm of colon, unspecified: Secondary | ICD-10-CM | POA: Diagnosis not present

## 2022-07-28 MED ORDER — ROSUVASTATIN CALCIUM 10 MG PO TABS
10.0000 mg | ORAL_TABLET | Freq: Every day | ORAL | 3 refills | Status: DC
  Filled 2022-07-28 – 2023-04-20 (×2): qty 90, 90d supply, fill #0

## 2022-08-19 DIAGNOSIS — Z13820 Encounter for screening for osteoporosis: Secondary | ICD-10-CM | POA: Diagnosis not present

## 2022-08-19 DIAGNOSIS — N958 Other specified menopausal and perimenopausal disorders: Secondary | ICD-10-CM | POA: Diagnosis not present

## 2022-08-19 DIAGNOSIS — M816 Localized osteoporosis [Lequesne]: Secondary | ICD-10-CM | POA: Diagnosis not present

## 2022-08-20 DIAGNOSIS — L814 Other melanin hyperpigmentation: Secondary | ICD-10-CM | POA: Diagnosis not present

## 2022-08-20 DIAGNOSIS — D170 Benign lipomatous neoplasm of skin and subcutaneous tissue of head, face and neck: Secondary | ICD-10-CM | POA: Diagnosis not present

## 2022-08-20 DIAGNOSIS — D1801 Hemangioma of skin and subcutaneous tissue: Secondary | ICD-10-CM | POA: Diagnosis not present

## 2022-08-20 DIAGNOSIS — L821 Other seborrheic keratosis: Secondary | ICD-10-CM | POA: Diagnosis not present

## 2022-09-03 NOTE — Progress Notes (Signed)
PCP - Rodrigo Ran, MD Clearance on chart 06-17-22  with LOV note Cardiologist -   PPM/ICD -  Device Orders -  Rep Notified -   Chest x-ray -  EKG -  Stress Test -  ECHO -  Cardiac Cath -  CT cardiac scoring - 2020  Sleep Study -  CPAP -   Fasting Blood Sugar -  Checks Blood Sugar _____ times a day  Blood Thinner Instructions: Aspirin Instructions:  ERAS Protcol - PRE-SURGERY Ensure    COVID vaccine -  Activity-- Anesthesia review:   Patient denies shortness of breath, fever, cough and chest pain at PAT appointment   All instructions explained to the patient, with a verbal understanding of the material. Patient agrees to go over the instructions while at home for a better understanding. Patient also instructed to self quarantine after being tested for COVID-19. The opportunity to ask questions was provided.

## 2022-09-03 NOTE — Patient Instructions (Signed)
SURGICAL WAITING ROOM VISITATION  Patients having surgery or a procedure may have no more than 2 support people in the waiting area - these visitors may rotate.    Children under the age of 72 must have an adult with them who is not the patient.   If the patient needs to stay at the hospital during part of their recovery, the visitor guidelines for inpatient rooms apply. Pre-op nurse will coordinate an appropriate time for 1 support person to accompany patient in pre-op.  This support person may not rotate.    Please refer to the [facility] website for the visitor guidelines for Inpatients (after your surgery is over and you are in a regular room).       Your procedure is scheduled on: 09-17-22   Report to [facility] Main Entrance    Report to admitting at     0630  AM   Call this number if you have problems the morning of surgery [phone]   Do not eat food :After Midnight.   After Midnight you may have the following liquids until __0530____ AM/ DAY OF SURGERY  then NOTHING BY MOUTH  Water Non-Citrus Juices (without pulp, NO RED-Apple, White grape, White cranberry) Black Coffee (NO MILK/CREAM OR CREAMERS, sugar ok)  Clear Tea (NO MILK/CREAM OR CREAMERS, sugar ok) regular and decaf                             Plain Jell-O (NO RED)                                           Fruit ices (not with fruit pulp, NO RED)                                     Popsicles (NO RED)                                                               Sports drinks like Gatorade (NO RED)                    The day of surgery:  Drink ONE (1) Pre-Surgery Clear Ensure  at      0515 AM the morning of surgery. Drink in one sitting. Do not sip.  This drink was given to you during your hospital  pre-op appointment visit. Nothing else to drink after completing the  Pre-Surgery Clear Ensure by 0530 am          If you have questions, please contact your surgeon's office.   FOLLOW ANY  ADDITIONAL PRE OP INSTRUCTIONS YOU RECEIVED FROM YOUR SURGEON'S OFFICE!!!     Oral Hygiene is also important to reduce your risk of infection.                                    Remember - BRUSH YOUR TEETH THE MORNING OF SURGERY WITH YOUR REGULAR TOOTHPASTE  DENTURES WILL BE REMOVED PRIOR  TO SURGERY PLEASE DO NOT APPLY "Poly grip" OR ADHESIVES!!!   Do NOT smoke after Midnight   Take these medicines the morning of surgery with A SIP OF WATER: rosuvastatin, Flonase, escitalopram, tylenol if needed                                You may not have any metal on your body including hair pins, jewelry, and body piercing             Do not wear make-up, lotions, powders, perfumes/cologne, or deodorant  Do not wear nail polish including gel and S&S, artificial/acrylic nails, or any other type of covering on natural nails including finger and toenails. If you have artificial nails, gel coating, etc. that needs to be removed by a nail salon please have this removed prior to surgery or surgery may need to be canceled/ delayed if the surgeon/ anesthesia feels like they are unable to be safely monitored.   Do not shave  48 hours prior to surgery.              Do not bring valuables to the hospital. [facility] IS NOT             RESPONSIBLE   FOR VALUABLES.   Contacts, glasses, dentures or bridgework may not be worn into surgery.   Bring small overnight bag day of surgery.   DO NOT BRING YOUR HOME MEDICATIONS TO THE HOSPITAL. PHARMACY WILL DISPENSE MEDICATIONS LISTED ON YOUR MEDICATION LIST TO YOU DURING YOUR ADMISSION IN THE HOSPITAL!    Patients discharged on the day of surgery will not be allowed to drive home.  Someone NEEDS to stay with you for the first 24 hours after anesthesia.   Special Instructions: Bring a copy of your healthcare power of attorney and living will documents the day of surgery if you haven't scanned them before.              Please read over the following fact sheets  you were given: IF YOU HAVE QUESTIONS ABOUT YOUR PRE-OP INSTRUCTIONS PLEASE CALL [phone]   If you received a COVID test during your pre-op visit  it is requested that you wear a mask when out in public, stay away from anyone that may not be feeling well and notify your surgeon if you develop symptoms. If you test positive for Covid or have been in contact with anyone that has tested positive in the last 10 days please notify you surgeon.      Pre-operative 5 CHG Bath Instructions   You can play a key role in reducing the risk of infection after surgery. Your skin needs to be as free of germs as possible. You can reduce the number of germs on your skin by washing with CHG (chlorhexidine gluconate) soap before surgery. CHG is an antiseptic soap that kills germs and continues to kill germs even after washing.   DO NOT use if you have an allergy to chlorhexidine/CHG or antibacterial soaps. If your skin becomes reddened or irritated, stop using the CHG and notify one of our RNs at [phone].   Please shower with the CHG soap starting 4 days before surgery using the following schedule:     Please keep in mind the following:  DO NOT shave, including legs and underarms, starting the day of your first shower.   You may shave your face at any point before/day of surgery.  Place clean sheets on your bed the day you start using CHG soap. Use a clean washcloth (not used since being washed) for each shower. DO NOT sleep with pets once you start using the CHG.   CHG Shower Instructions:  If you choose to wash your hair and private area, wash first with your normal shampoo/soap.  After you use shampoo/soap, rinse your hair and body thoroughly to remove shampoo/soap residue.  Turn the water OFF and apply about 3 tablespoons (45 ml) of CHG soap to a CLEAN washcloth.  Apply CHG soap ONLY FROM YOUR NECK DOWN TO YOUR TOES (washing for 3-5 minutes)  DO NOT use CHG soap on face, private areas, open  wounds, or sores.  Pay special attention to the area where your surgery is being performed.  If you are having back surgery, having someone wash your back for you may be helpful. Wait 2 minutes after CHG soap is applied, then you may rinse off the CHG soap.  Pat dry with a clean towel  Put on clean clothes/pajamas   If you choose to wear lotion, please use ONLY the CHG-compatible lotions on the back of this paper.     Additional instructions for the day of surgery: DO NOT APPLY any lotions, deodorants, cologne, or perfumes.   Put on clean/comfortable clothes.  Brush your teeth.  Ask your nurse before applying any prescription medications to the skin.      CHG Compatible Lotions   Aveeno Moisturizing lotion  Cetaphil Moisturizing Cream  Cetaphil Moisturizing Lotion  Clairol Herbal Essence Moisturizing Lotion, Dry Skin  Clairol Herbal Essence Moisturizing Lotion, Extra Dry Skin  Clairol Herbal Essence Moisturizing Lotion, Normal Skin  Curel Age Defying Therapeutic Moisturizing Lotion with Alpha Hydroxy  Curel Extreme Care Body Lotion  Curel Soothing Hands Moisturizing Hand Lotion  Curel Therapeutic Moisturizing Cream, Fragrance-Free  Curel Therapeutic Moisturizing Lotion, Fragrance-Free  Curel Therapeutic Moisturizing Lotion, Original Formula  Eucerin Daily Replenishing Lotion  Eucerin Dry Skin Therapy Plus Alpha Hydroxy Crme  Eucerin Dry Skin Therapy Plus Alpha Hydroxy Lotion  Eucerin Original Crme  Eucerin Original Lotion  Eucerin Plus Crme Eucerin Plus Lotion  Eucerin TriLipid Replenishing Lotion  Keri Anti-Bacterial Hand Lotion  Keri Deep Conditioning Original Lotion Dry Skin Formula Softly Scented  Keri Deep Conditioning Original Lotion, Fragrance Free Sensitive Skin Formula  Keri Lotion Fast Absorbing Fragrance Free Sensitive Skin Formula  Keri Lotion Fast Absorbing Softly Scented Dry Skin Formula  Keri Original Lotion  Keri Skin Renewal Lotion Keri Silky Smooth  Lotion  Keri Silky Smooth Sensitive Skin Lotion  Nivea Body Creamy Conditioning Oil  Nivea Body Extra Enriched Teacher, adult education Moisturizing Lotion Nivea Crme  Nivea Skin Firming Lotion  NutraDerm 30 Skin Lotion  NutraDerm Skin Lotion  NutraDerm Therapeutic Skin Cream  NutraDerm Therapeutic Skin Lotion  ProShield Protective Hand Cream  Provon moisturizing lotion      WHAT IS A BLOOD TRANSFUSION? Blood Transfusion Information  A transfusion is the replacement of blood or some of its parts. Blood is made up of multiple cells which provide different functions. Red blood cells carry oxygen and are used for blood loss replacement. White blood cells fight against infection. Platelets control bleeding. Plasma helps clot blood. Other blood products are available for specialized needs, such as hemophilia or other clotting disorders. BEFORE THE TRANSFUSION  Who gives blood for transfusions?  Healthy volunteers who are fully evaluated to make sure their blood is safe.  This is blood bank blood. Transfusion therapy is the safest it has ever been in the practice of medicine. Before blood is taken from a donor, a complete history is taken to make sure that person has no history of diseases nor engages in risky social behavior (examples are intravenous drug use or sexual activity with multiple partners). The donor's travel history is screened to minimize risk of transmitting infections, such as malaria. The donated blood is tested for signs of infectious diseases, such as HIV and hepatitis. The blood is then tested to be sure it is compatible with you in order to minimize the chance of a transfusion reaction. If you or a relative donates blood, this is often done in anticipation of surgery and is not appropriate for emergency situations. It takes many days to process the donated blood. RISKS AND COMPLICATIONS Although transfusion therapy is very safe and saves many  lives, the main dangers of transfusion include:  Getting an infectious disease. Developing a transfusion reaction. This is an allergic reaction to something in the blood you were given. Every precaution is taken to prevent this. The decision to have a blood transfusion has been considered carefully by your caregiver before blood is given. Blood is not given unless the benefits outweigh the risks. AFTER THE TRANSFUSION Right after receiving a blood transfusion, you will usually feel much better and more energetic. This is especially true if your red blood cells have gotten low (anemic). The transfusion raises the level of the red blood cells which carry oxygen, and this usually causes an energy increase. The nurse administering the transfusion will monitor you carefully for complications. HOME CARE INSTRUCTIONS  No special instructions are needed after a transfusion. You may find your energy is better. Speak with your caregiver about any limitations on activity for underlying diseases you may have. SEEK MEDICAL CARE IF:  Your condition is not improving after your transfusion. You develop redness or irritation at the intravenous (IV) site. SEEK IMMEDIATE MEDICAL CARE IF:  Any of the following symptoms occur over the next 12 hours: Shaking chills. You have a temperature by mouth above 102 F (38.9 C), not controlled by medicine. Chest, back, or muscle pain. People around you feel you are not acting correctly or are confused. Shortness of breath or difficulty breathing. Dizziness and fainting. You get a rash or develop hives. You have a decrease in urine output. Your urine turns a dark color or changes to pink, red, or brown. Any of the following symptoms occur over the next 10 days: You have a temperature by mouth above 102 F (38.9 C), not controlled by medicine. Shortness of breath. Weakness after normal activity. The white part of the eye turns yellow (jaundice). You have a decrease in  the amount of urine or are urinating less often. Your urine turns a dark color or changes to pink, red, or brown. Document Released: 01/25/2000 Document Revised: 04/21/2011 Document Reviewed: 09/13/2007 [employer] Patient Information 2014 [place], [place].  _______________________________________________________________________  Incentive Spirometer  An incentive spirometer is a tool that can help keep your lungs clear and active. This tool measures how well you are filling your lungs with each breath. Taking long deep breaths may help reverse or decrease the chance of developing breathing (pulmonary) problems (especially infection) following: A long period of time when you are unable to move or be active. BEFORE THE PROCEDURE  If the spirometer includes an indicator to show your best effort, your nurse or respiratory therapist will set it  to a desired goal. If possible, sit up straight or lean slightly forward. Try not to slouch. Hold the incentive spirometer in an upright position. INSTRUCTIONS FOR USE  Sit on the edge of your bed if possible, or sit up as far as you can in bed or on a chair. Hold the incentive spirometer in an upright position. Breathe out normally. Place the mouthpiece in your mouth and seal your lips tightly around it. Breathe in slowly and as deeply as possible, raising the piston or the ball toward the top of the column. Hold your breath for 3-5 seconds or for as long as possible. Allow the piston or ball to fall to the bottom of the column. Remove the mouthpiece from your mouth and breathe out normally. Rest for a few seconds and repeat Steps 1 through 7 at least 10 times every 1-2 hours when you are awake. Take your time and take a few normal breaths between deep breaths. The spirometer may include an indicator to show your best effort. Use the indicator as a goal to work toward during each repetition. After each set of 10 deep breaths, practice coughing to be sure  your lungs are clear. If you have an incision (the cut made at the time of surgery), support your incision when coughing by placing a pillow or rolled up towels firmly against it. Once you are able to get out of bed, walk around indoors and cough well. You may stop using the incentive spirometer when instructed by your caregiver.  RISKS AND COMPLICATIONS Take your time so you do not get dizzy or light-headed. If you are in pain, you may need to take or ask for pain medication before doing incentive spirometry. It is harder to take a deep breath if you are having pain. AFTER USE Rest and breathe slowly and easily. It can be helpful to keep track of a log of your progress. Your caregiver can provide you with a simple table to help with this. If you are using the spirometer at home, follow these instructions: SEEK MEDICAL CARE IF:  You are having difficultly using the spirometer. You have trouble using the spirometer as often as instructed. Your pain medication is not giving enough relief while using the spirometer. You develop fever of 100.5 F (38.1 C) or higher. SEEK IMMEDIATE MEDICAL CARE IF:  You cough up bloody sputum that had not been present before. You develop fever of 102 F (38.9 C) or greater. You develop worsening pain at or near the incision site. MAKE SURE YOU:  Understand these instructions. Will watch your condition. Will get help right away if you are not doing well or get worse. Document Released: 06/09/2006 Document Revised: 04/21/2011 Document Reviewed: 08/10/2006 [facility] Patient Information 2014 [place], [place].   ________________________________________________________________________

## 2022-09-04 ENCOUNTER — Encounter (HOSPITAL_COMMUNITY)
Admission: RE | Admit: 2022-09-04 | Discharge: 2022-09-04 | Disposition: A | Discharge status: Home / Self Care | Payer: 59 | Source: Ambulatory Visit | Attending: Anesthesiology | Admitting: Anesthesiology

## 2022-09-04 DIAGNOSIS — Z01818 Encounter for other preprocedural examination: Secondary | ICD-10-CM

## 2022-09-10 ENCOUNTER — Encounter (HOSPITAL_COMMUNITY): Payer: Self-pay

## 2022-09-10 ENCOUNTER — Encounter (HOSPITAL_COMMUNITY)
Admission: RE | Admit: 2022-09-10 | Discharge: 2022-09-10 | Disposition: A | Discharge status: Home / Self Care | Payer: 59 | Source: Ambulatory Visit | Attending: Orthopedic Surgery | Admitting: Orthopedic Surgery

## 2022-09-10 ENCOUNTER — Other Ambulatory Visit: Payer: Self-pay

## 2022-09-10 ENCOUNTER — Other Ambulatory Visit (HOSPITAL_COMMUNITY): Payer: Self-pay

## 2022-09-10 DIAGNOSIS — Z01812 Encounter for preprocedural laboratory examination: Secondary | ICD-10-CM | POA: Diagnosis not present

## 2022-09-10 DIAGNOSIS — Z1231 Encounter for screening mammogram for malignant neoplasm of breast: Secondary | ICD-10-CM | POA: Diagnosis not present

## 2022-09-10 DIAGNOSIS — N952 Postmenopausal atrophic vaginitis: Secondary | ICD-10-CM | POA: Diagnosis not present

## 2022-09-10 DIAGNOSIS — M810 Age-related osteoporosis without current pathological fracture: Secondary | ICD-10-CM | POA: Diagnosis not present

## 2022-09-10 DIAGNOSIS — Z01419 Encounter for gynecological examination (general) (routine) without abnormal findings: Secondary | ICD-10-CM | POA: Diagnosis not present

## 2022-09-10 DIAGNOSIS — Z6824 Body mass index (BMI) 24.0-24.9, adult: Secondary | ICD-10-CM | POA: Diagnosis not present

## 2022-09-10 DIAGNOSIS — Z01818 Encounter for other preprocedural examination: Secondary | ICD-10-CM

## 2022-09-10 HISTORY — DX: Unspecified osteoarthritis, unspecified site: M19.90

## 2022-09-10 HISTORY — DX: Depression, unspecified: F32.A

## 2022-09-10 LAB — SURGICAL PCR SCREEN
MRSA, PCR: NEGATIVE
Staphylococcus aureus: NEGATIVE

## 2022-09-10 LAB — CBC
HCT: 44.9 % (ref 36.0–46.0)
Hemoglobin: 14.7 g/dL (ref 12.0–15.0)
MCH: 29.5 pg (ref 26.0–34.0)
MCHC: 32.7 g/dL (ref 30.0–36.0)
MCV: 90 fL (ref 80.0–100.0)
Platelets: 241 10*3/uL (ref 150–400)
RBC: 4.99 MIL/uL (ref 3.87–5.11)
RDW: 12.9 % (ref 11.5–15.5)
WBC: 5.5 10*3/uL (ref 4.0–10.5)
nRBC: 0 % (ref 0.0–0.2)

## 2022-09-10 LAB — TYPE AND SCREEN
ABO/RH(D): O POS
Antibody Screen: NEGATIVE

## 2022-09-10 MED ORDER — ESTRADIOL 0.1 MG/GM VA CREA
TOPICAL_CREAM | Freq: Every day | VAGINAL | 0 refills | Status: DC
  Filled 2022-09-10: qty 42.5, 30d supply, fill #0

## 2022-09-10 MED ORDER — ESTRADIOL 0.1 MG/GM VA CREA
TOPICAL_CREAM | VAGINAL | 12 refills | Status: AC
  Filled 2022-09-10: qty 42.5, 90d supply, fill #0
  Filled 2023-04-20: qty 42.5, 30d supply, fill #0

## 2022-09-10 MED ORDER — ALENDRONATE SODIUM 70 MG PO TABS
70.0000 mg | ORAL_TABLET | ORAL | 4 refills | Status: AC
Start: 2022-09-10 — End: ?
  Filled 2022-09-10: qty 12, 84d supply, fill #0

## 2022-09-15 NOTE — H&P (Signed)
TOTAL HIP ADMISSION H&P  Patient is admitted for left total hip arthroplasty.  Subjective:  Chief Complaint: Left hip pain  HPI: Breanna Werner, 57 y.o. female, has a history of pain and functional disability in the left hip due to arthritis and patient has failed non-surgical conservative treatments for greater than 12 weeks to include NSAID's and/or analgesics, corticosteriod injections, and activity modification. Onset of symptoms was gradual, starting  several  years ago with gradually worsening course since that time. The patient noted no past surgery on the left hip. Patient currently rates pain in the left hip at 7 out of 10 with activity. Patient has night pain, trendelenberg gait, pain that interfers with activities of daily living, and pain with passive range of motion. Patient has evidence of  severe bone-on-bone arthritis in the left hip with a large marginal osteophyte off the femoral head  by imaging studies. This condition presents safety issues increasing the risk of falls.There is no current active infection.  There are no problems to display for this patient.   Past Medical History:  Diagnosis Date   Arthritis    Depression     Past Surgical History:  Procedure Laterality Date   COLONOSCOPY      Prior to Admission medications   Medication Sig Start Date End Date Taking? Authorizing Provider  acetaminophen (TYLENOL) 650 MG CR tablet Take 1,300 mg by mouth 2 (two) times daily.   Yes [provider]  cholecalciferol (VITAMIN D3) 25 MCG (1000 UNIT) tablet Take 1,000 Units by mouth daily.   Yes [provider]  escitalopram (LEXAPRO) 10 MG tablet Take 1 tablet (10 mg total) by mouth daily. 09/16/21  Yes   ezetimibe (ZETIA) 10 MG tablet Take 1 tablet (10 mg total) by mouth daily. 02/04/22  Yes   fluticasone (FLONASE) 50 MCG/ACT nasal spray Place 2 sprays into both nostrils daily.   Yes [provider]  ibuprofen (ADVIL) 200 MG tablet Take 400 mg by  mouth every 6 (six) hours as needed (Ear pain).   Yes [provider]  rosuvastatin (CRESTOR) 10 MG tablet Take 1 tablet (10 mg total) by mouth daily. 07/28/22  Yes   valACYclovir (VALTREX) 1000 MG tablet Take 2 tablets by mouth at the first sign of fever blister and take 2 more tablets 12 hours later then stop as directed. 07/14/22  Yes   alendronate (FOSAMAX) 70 MG tablet Take 1 tablet (70 mg total) by mouth once a week. 09/10/22     COVID-19 At Home Antigen Test [facility] COVID-19 HOME TEST) KIT Use as directed 07/12/20   Enid Baas, RPH  estradiol (ESTRACE) 0.1 MG/GM vaginal cream Insert 1 applicatorful vaginally twice a week at bedtime. 09/11/22     estradiol (ESTRACE) 0.1 MG/GM vaginal cream Insert 1 applicatorful every day at bedtime for 14 days. 09/10/22     valACYclovir (VALTREX) 1000 MG tablet Take 2 tablets (2,000 mg total) by mouth at first sign of fever blister & 2 more tablets 12 hours later then stop as directed Patient not taking: Reported on 09/01/2022 06/20/21       No Known Allergies  Social History   Socioeconomic History   Marital status: Married    Spouse name: Not on file   Number of children: Not on file   Years of education: Not on file   Highest education level: Not on file  Occupational History   Not on file  Tobacco Use   Smoking status: Never  Smokeless tobacco: Never  Vaping Use   Vaping status: Never Used  Substance and Sexual Activity   Alcohol use: Yes    Comment: ocas.   Drug use: Never   Sexual activity: Not on file  Other Topics Concern   Not on file  Social History Narrative   Not on file   Social Determinants of Health   Financial Resource Strain: Not on file  Food Insecurity: Not on file  Transportation Needs: Not on file  Physical Activity: Not on file  Stress: Not on file  Social Connections: Not on file  Intimate Partner Violence: Not on file    Tobacco Use: Low Risk  (09/10/2022)   Patient History    Smoking Tobacco  Use: Never    Smokeless Tobacco Use: Never    Passive Exposure: Not on file   Social History   Substance and Sexual Activity  Alcohol Use Yes   Comment: ocas.    Family History  Problem Relation Age of Onset   Breast cancer Neg Hx     Review of Systems  Constitutional:  Negative for chills and fever.  HENT:  Negative for congestion, sore throat and tinnitus.   Eyes:  Negative for double vision, photophobia and pain.  Respiratory:  Negative for cough, shortness of breath and wheezing.   Cardiovascular:  Negative for chest pain, palpitations and orthopnea.  Gastrointestinal:  Negative for heartburn, nausea and vomiting.  Genitourinary:  Negative for dysuria, frequency and urgency.  Musculoskeletal:  Positive for joint pain.  Neurological:  Negative for dizziness, weakness and headaches.     Objective:  Physical Exam: Well-developed female, alert and oriented, no apparent distress.    - Antalgic gait pattern on the left.    - Examination of the right hip shows flexion to 120 rotation in 20 abduction 30 and external rotation of 20. There is no tenderness over the greater trochanter.    - Left hip Range of motion flexion to 100, no internal rotation, about 10 degrees of external rotation, 10 degrees of abduction. No trochanteric tenderness on the left.   Imaging Review Plain radiographs demonstrate severe degenerative joint disease of the left hip. The bone quality appears to be adequate for age and reported activity level.  Assessment/Plan:  End stage arthritis, left hip  The patient history, physical examination, clinical judgement of the provider and imaging studies are consistent with end stage degenerative joint disease of the left hip and total hip arthroplasty is deemed medically necessary. The treatment options including medical management, injection therapy, arthroscopy and arthroplasty were discussed at length. The risks and benefits of total hip arthroplasty were  presented and reviewed. The risks due to aseptic loosening, infection, stiffness, dislocation/subluxation, thromboembolic complications and other imponderables were discussed. The patient acknowledged the explanation, agreed to proceed with the plan and consent was signed. Patient is being admitted for inpatient treatment for surgery, pain control, PT, OT, prophylactic antibiotics, VTE prophylaxis, progressive ambulation and ADLs and discharge planning.The patient is planning to be discharged  home .   Patient's anticipated LOS is less than 2 midnights, meeting these requirements: - Lives within 1 hour of care - Has a competent adult at home to recover with post-op recover - NO history of  - Chronic pain requiring opioids  - Diabetes  - Coronary Artery Disease  - Heart failure  - Heart attack  - Stroke  - DVT/VTE  - Cardiac arrhythmia  - Respiratory Failure/COPD  - Renal failure  - Anemia  -  Advanced Liver disease  Therapy Plans: HEP Disposition: Home with husband Planned DVT Prophylaxis: Aspirin Protocol DME Needed: Ordered PCP: Dr. Waynard Edwards (clearance received) Cardiology: None TXA: IV Allergies: NKDA Anesthesia Concerns: None BMI: 23.3 Last HgbA1c: Not diabetic [facility]acy: [facility] [address]   - Patient was instructed on what medications to stop prior to surgery. - Follow-up visit in 2 weeks with Dr. Lequita Halt - Begin physical therapy following surgery - Pre-operative lab work as pre-surgical testing - Prescriptions will be provided in hospital at time of discharge  Arther Abbott, PA-C Orthopedic Surgery [facility] [place]

## 2022-09-16 NOTE — Anesthesia Preprocedure Evaluation (Signed)
Anesthesia Evaluation  Patient identified by MRN, date of birth, ID band Patient awake    Reviewed: Allergy & Precautions, NPO status , Patient's Chart, lab work & pertinent test results  Airway Mallampati: I       Dental no notable dental hx.    Pulmonary neg pulmonary ROS   Pulmonary exam normal        Cardiovascular negative cardio ROS Normal cardiovascular exam     Neuro/Psych  PSYCHIATRIC DISORDERS  Depression    negative neurological ROS     GI/Hepatic negative GI ROS, Neg liver ROS,,,  Endo/Other  negative endocrine ROS    Renal/GU negative Renal ROS  negative genitourinary   Musculoskeletal  (+) Arthritis , Osteoarthritis,    Abdominal Normal abdominal exam  (+)   Peds  Hematology negative hematology ROS (+)   Anesthesia Other Findings   Reproductive/Obstetrics                             Anesthesia Physical Anesthesia Plan  ASA: 2  Anesthesia Plan: Spinal   Post-op Pain Management: Minimal or no pain anticipated   Induction:   PONV Risk Score and Plan: 2 and Ondansetron, Dexamethasone and Midazolam  Airway Management Planned: Natural Airway and Simple Face Mask  Additional Equipment: None  Intra-op Plan:   Post-operative Plan:   Informed Consent: I have reviewed the patients History and Physical, chart, labs and discussed the procedure including the risks, benefits and alternatives for the proposed anesthesia with the patient or authorized representative who has indicated his/her understanding and acceptance.       Plan Discussed with: CRNA  Anesthesia Plan Comments:        Anesthesia Quick Evaluation

## 2022-09-17 ENCOUNTER — Encounter (HOSPITAL_COMMUNITY): Payer: Self-pay | Admitting: Orthopedic Surgery

## 2022-09-17 ENCOUNTER — Encounter (HOSPITAL_COMMUNITY)
Admission: RE | Disposition: A | Discharge status: Home / Self Care | Payer: Self-pay | Source: Home / Self Care | Attending: Orthopedic Surgery

## 2022-09-17 ENCOUNTER — Ambulatory Visit (HOSPITAL_COMMUNITY): Payer: 59

## 2022-09-17 ENCOUNTER — Other Ambulatory Visit (HOSPITAL_COMMUNITY): Payer: Self-pay

## 2022-09-17 ENCOUNTER — Ambulatory Visit (HOSPITAL_BASED_OUTPATIENT_CLINIC_OR_DEPARTMENT_OTHER): Payer: 59 | Admitting: Anesthesiology

## 2022-09-17 ENCOUNTER — Ambulatory Visit (HOSPITAL_COMMUNITY)
Admission: RE | Admit: 2022-09-17 | Discharge: 2022-09-17 | Disposition: A | Discharge status: Home / Self Care | Payer: 59 | Attending: Orthopedic Surgery | Admitting: Orthopedic Surgery

## 2022-09-17 ENCOUNTER — Ambulatory Visit (HOSPITAL_COMMUNITY): Payer: 59 | Admitting: Anesthesiology

## 2022-09-17 ENCOUNTER — Other Ambulatory Visit: Payer: Self-pay

## 2022-09-17 DIAGNOSIS — M1611 Unilateral primary osteoarthritis, right hip: Secondary | ICD-10-CM | POA: Diagnosis not present

## 2022-09-17 DIAGNOSIS — R262 Difficulty in walking, not elsewhere classified: Secondary | ICD-10-CM | POA: Insufficient documentation

## 2022-09-17 DIAGNOSIS — M1612 Unilateral primary osteoarthritis, left hip: Secondary | ICD-10-CM

## 2022-09-17 DIAGNOSIS — Z96642 Presence of left artificial hip joint: Secondary | ICD-10-CM | POA: Diagnosis not present

## 2022-09-17 HISTORY — PX: TOTAL HIP ARTHROPLASTY: SHX124

## 2022-09-17 SURGERY — ARTHROPLASTY, HIP, TOTAL, ANTERIOR APPROACH
Anesthesia: Spinal | Site: Hip | Laterality: Left

## 2022-09-17 MED ORDER — DEXAMETHASONE SODIUM PHOSPHATE 10 MG/ML IJ SOLN
8.0000 mg | Freq: Once | INTRAMUSCULAR | Status: AC
  Administered 2022-09-17: 8 mg via INTRAVENOUS

## 2022-09-17 MED ORDER — LACTATED RINGERS IV BOLUS
250.0000 mL | Freq: Once | INTRAVENOUS | Status: AC
  Administered 2022-09-17: 250 mL via INTRAVENOUS

## 2022-09-17 MED ORDER — MIDAZOLAM HCL 2 MG/2ML IJ SOLN
INTRAMUSCULAR | Status: AC
  Filled 2022-09-17: qty 2

## 2022-09-17 MED ORDER — KETOROLAC TROMETHAMINE 30 MG/ML IJ SOLN
INTRAMUSCULAR | Status: AC
  Filled 2022-09-17: qty 1

## 2022-09-17 MED ORDER — ACETAMINOPHEN 325 MG PO TABS: 325.0000 mg | ORAL_TABLET | ORAL | Status: DC | PRN

## 2022-09-17 MED ORDER — PHENYLEPHRINE HCL-NACL 20-0.9 MG/250ML-% IV SOLN
INTRAVENOUS | Status: DC | PRN
  Administered 2022-09-17: 50 ug/min via INTRAVENOUS

## 2022-09-17 MED ORDER — WATER FOR IRRIGATION, STERILE IR SOLN
Status: DC | PRN
  Administered 2022-09-17: 2000 mL

## 2022-09-17 MED ORDER — METHOCARBAMOL 500 MG IVPB - SIMPLE MED
500.0000 mg | Freq: Four times a day (QID) | INTRAVENOUS | Status: DC | PRN
  Administered 2022-09-17: 500 mg via INTRAVENOUS

## 2022-09-17 MED ORDER — PROPOFOL 10 MG/ML IV BOLUS
INTRAVENOUS | Status: DC | PRN
Start: 2022-09-17 — End: 2022-09-17
  Administered 2022-09-17 (×2): 20 mg via INTRAVENOUS

## 2022-09-17 MED ORDER — GLYCOPYRROLATE 0.2 MG/ML IJ SOLN
INTRAMUSCULAR | Status: AC
  Filled 2022-09-17: qty 1

## 2022-09-17 MED ORDER — CHLORHEXIDINE GLUCONATE 0.12 % MT SOLN
15.0000 mL | Freq: Once | OROMUCOSAL | Status: AC
  Administered 2022-09-17: 15 mL via OROMUCOSAL

## 2022-09-17 MED ORDER — OXYCODONE HCL 5 MG/5ML PO SOLN: 5.0000 mg | Freq: Once | ORAL | Status: AC | PRN

## 2022-09-17 MED ORDER — LACTATED RINGERS IV BOLUS
500.0000 mL | Freq: Once | INTRAVENOUS | Status: AC
  Administered 2022-09-17: 500 mL via INTRAVENOUS

## 2022-09-17 MED ORDER — POVIDONE-IODINE 10 % EX SWAB
2.0000 | Freq: Once | CUTANEOUS | Status: AC
  Administered 2022-09-17: 2 via TOPICAL

## 2022-09-17 MED ORDER — METHOCARBAMOL 500 MG PO TABS: 500.0000 mg | ORAL_TABLET | Freq: Four times a day (QID) | ORAL | Status: DC | PRN

## 2022-09-17 MED ORDER — ASPIRIN 81 MG PO TBEC
DELAYED_RELEASE_TABLET | ORAL | 0 refills | Status: AC
  Filled 2022-09-17: qty 63, 42d supply, fill #0

## 2022-09-17 MED ORDER — LACTATED RINGERS IV SOLN: INTRAVENOUS | Status: DC

## 2022-09-17 MED ORDER — ONDANSETRON HCL 4 MG PO TABS
4.0000 mg | ORAL_TABLET | Freq: Three times a day (TID) | ORAL | 0 refills | Status: DC | PRN
  Filled 2022-09-17: qty 20, 7d supply, fill #0

## 2022-09-17 MED ORDER — OXYCODONE HCL 5 MG PO TABS
ORAL_TABLET | ORAL | Status: AC
  Filled 2022-09-17: qty 1

## 2022-09-17 MED ORDER — KETOROLAC TROMETHAMINE 30 MG/ML IJ SOLN
30.0000 mg | Freq: Once | INTRAMUSCULAR | Status: AC | PRN
  Administered 2022-09-17: 30 mg via INTRAVENOUS

## 2022-09-17 MED ORDER — BUPIVACAINE-EPINEPHRINE 0.25% -1:200000 IJ SOLN
INTRAMUSCULAR | Status: AC
  Filled 2022-09-17: qty 1

## 2022-09-17 MED ORDER — PROPOFOL 500 MG/50ML IV EMUL
INTRAVENOUS | Status: DC | PRN
  Administered 2022-09-17: 80 ug/kg/min via INTRAVENOUS

## 2022-09-17 MED ORDER — 0.9 % SODIUM CHLORIDE (POUR BTL) OPTIME
TOPICAL | Status: DC | PRN
  Administered 2022-09-17: 1000 mL

## 2022-09-17 MED ORDER — MIDAZOLAM HCL 5 MG/5ML IJ SOLN
INTRAMUSCULAR | Status: DC | PRN
  Administered 2022-09-17: 2 mg via INTRAVENOUS

## 2022-09-17 MED ORDER — METHOCARBAMOL 500 MG PO TABS
500.0000 mg | ORAL_TABLET | Freq: Four times a day (QID) | ORAL | 0 refills | Status: DC | PRN
  Filled 2022-09-17: qty 40, 10d supply, fill #0

## 2022-09-17 MED ORDER — BUPIVACAINE-EPINEPHRINE (PF) 0.25% -1:200000 IJ SOLN
INTRAMUSCULAR | Status: DC | PRN
  Administered 2022-09-17: 30 mL

## 2022-09-17 MED ORDER — METHOCARBAMOL 500 MG IVPB - SIMPLE MED
INTRAVENOUS | Status: AC
  Filled 2022-09-17: qty 55

## 2022-09-17 MED ORDER — GLYCOPYRROLATE 0.2 MG/ML IJ SOLN
INTRAMUSCULAR | Status: DC | PRN
  Administered 2022-09-17 (×2): .1 mg via INTRAVENOUS

## 2022-09-17 MED ORDER — ACETAMINOPHEN 160 MG/5ML PO SOLN: 325.0000 mg | ORAL | Status: DC | PRN

## 2022-09-17 MED ORDER — FENTANYL CITRATE PF 50 MCG/ML IJ SOSY
25.0000 ug | PREFILLED_SYRINGE | INTRAMUSCULAR | Status: DC | PRN
  Administered 2022-09-17: 25 ug via INTRAVENOUS

## 2022-09-17 MED ORDER — DEXAMETHASONE SODIUM PHOSPHATE 10 MG/ML IJ SOLN
INTRAMUSCULAR | Status: AC
  Filled 2022-09-17: qty 1

## 2022-09-17 MED ORDER — BUPIVACAINE IN DEXTROSE 0.75-8.25 % IT SOLN
INTRATHECAL | Status: DC | PRN
  Administered 2022-09-17: 1.4 mL via INTRATHECAL

## 2022-09-17 MED ORDER — OXYCODONE HCL 5 MG PO TABS
5.0000 mg | ORAL_TABLET | Freq: Four times a day (QID) | ORAL | 0 refills | Status: DC | PRN
  Filled 2022-09-17: qty 42, 6d supply, fill #0

## 2022-09-17 MED ORDER — ONDANSETRON HCL 4 MG/2ML IJ SOLN
INTRAMUSCULAR | Status: DC | PRN
  Administered 2022-09-17: 4 mg via INTRAVENOUS

## 2022-09-17 MED ORDER — FENTANYL CITRATE (PF) 100 MCG/2ML IJ SOLN
INTRAMUSCULAR | Status: AC
  Filled 2022-09-17: qty 2

## 2022-09-17 MED ORDER — OXYCODONE HCL 5 MG PO TABS
5.0000 mg | ORAL_TABLET | Freq: Once | ORAL | Status: AC | PRN
  Administered 2022-09-17: 5 mg via ORAL

## 2022-09-17 MED ORDER — TRANEXAMIC ACID-NACL 1000-0.7 MG/100ML-% IV SOLN
1000.0000 mg | INTRAVENOUS | Status: AC
  Administered 2022-09-17: 1000 mg via INTRAVENOUS
  Filled 2022-09-17: qty 100

## 2022-09-17 MED ORDER — MEPERIDINE HCL 50 MG/ML IJ SOLN: 6.2500 mg | INTRAMUSCULAR | Status: DC | PRN

## 2022-09-17 MED ORDER — FENTANYL CITRATE (PF) 100 MCG/2ML IJ SOLN
INTRAMUSCULAR | Status: DC | PRN
  Administered 2022-09-17: 50 ug via INTRAVENOUS

## 2022-09-17 MED ORDER — CEFAZOLIN SODIUM-DEXTROSE 2-4 GM/100ML-% IV SOLN
2.0000 g | INTRAVENOUS | Status: AC
  Administered 2022-09-17: 2 g via INTRAVENOUS
  Filled 2022-09-17: qty 100

## 2022-09-17 MED ORDER — ONDANSETRON HCL 4 MG/2ML IJ SOLN
INTRAMUSCULAR | Status: AC
  Filled 2022-09-17: qty 2

## 2022-09-17 MED ORDER — ORAL CARE MOUTH RINSE: 15.0000 mL | Freq: Once | OROMUCOSAL | Status: AC

## 2022-09-17 MED ORDER — PROPOFOL 1000 MG/100ML IV EMUL
INTRAVENOUS | Status: AC
  Filled 2022-09-17: qty 100

## 2022-09-17 MED ORDER — ONDANSETRON HCL 4 MG/2ML IJ SOLN: 4.0000 mg | Freq: Once | INTRAMUSCULAR | Status: DC | PRN

## 2022-09-17 MED ORDER — FENTANYL CITRATE PF 50 MCG/ML IJ SOSY
PREFILLED_SYRINGE | INTRAMUSCULAR | Status: AC
  Filled 2022-09-17: qty 1

## 2022-09-17 MED ORDER — ACETAMINOPHEN 10 MG/ML IV SOLN
1000.0000 mg | Freq: Four times a day (QID) | INTRAVENOUS | Status: DC
  Administered 2022-09-17: 1000 mg via INTRAVENOUS
  Filled 2022-09-17: qty 100

## 2022-09-17 SURGICAL SUPPLY — 42 items
ADH SKN CLS APL DERMABOND .7 (GAUZE/BANDAGES/DRESSINGS) ×1
BAG COUNTER SPONGE SURGICOUNT (BAG) IMPLANT
BAG SPEC THK2 15X12 ZIP CLS (MISCELLANEOUS)
BAG SPNG CNTER NS LX DISP (BAG) ×1
BAG ZIPLOCK 12X15 (MISCELLANEOUS) IMPLANT
BALL HIP CERAMIC 32MM PLUS 9 IMPLANT
BLADE SAG 18X100X1.27 (BLADE) ×1 IMPLANT
COVER PERINEAL POST (MISCELLANEOUS) ×1 IMPLANT
COVER SURGICAL LIGHT HANDLE (MISCELLANEOUS) ×1 IMPLANT
CUP ACET PINNACLE SECTR 50MM (Hips) IMPLANT
DERMABOND ADVANCED .7 DNX12 (GAUZE/BANDAGES/DRESSINGS) ×1 IMPLANT
DRAPE FOOT SWITCH (DRAPES) ×1 IMPLANT
DRAPE STERI IOBAN 125X83 (DRAPES) ×1 IMPLANT
DRAPE U-SHAPE 47X51 STRL (DRAPES) ×2 IMPLANT
DRSG AQUACEL AG ADV 3.5X10 (GAUZE/BANDAGES/DRESSINGS) ×1 IMPLANT
DURAPREP 26ML APPLICATOR (WOUND CARE) ×1 IMPLANT
ELECT REM PT RETURN 15FT ADLT (MISCELLANEOUS) ×1 IMPLANT
GLOVE BIO SURGEON STRL SZ 6.5 (GLOVE) IMPLANT
GLOVE BIO SURGEON STRL SZ8 (GLOVE) ×1 IMPLANT
GLOVE BIOGEL PI IND STRL 6.5 (GLOVE) IMPLANT
GLOVE BIOGEL PI IND STRL 7.0 (GLOVE) IMPLANT
GLOVE BIOGEL PI IND STRL 8 (GLOVE) ×1 IMPLANT
GOWN STRL REUS W/ TWL LRG LVL3 (GOWN DISPOSABLE) ×1 IMPLANT
GOWN STRL REUS W/TWL LRG LVL3 (GOWN DISPOSABLE) ×1
HIP BALL CERAMIC 32MM PLUS 9 ×1 IMPLANT
HOLDER FOLEY CATH W/STRAP (MISCELLANEOUS) ×1 IMPLANT
KIT TURNOVER KIT A (KITS) IMPLANT
LINER MARATHON 32 50 (Hips) IMPLANT
MANIFOLD NEPTUNE II (INSTRUMENTS) ×1 IMPLANT
PACK ANTERIOR HIP CUSTOM (KITS) ×1 IMPLANT
PENCIL SMOKE EVACUATOR COATED (MISCELLANEOUS) ×1 IMPLANT
PINNACLE SECTOR CUP 50MM (Hips) ×1 IMPLANT
SPIKE FLUID TRANSFER (MISCELLANEOUS) ×1 IMPLANT
STEM FEMORAL SZ 5MM STD ACTIS (Stem) IMPLANT
SUT ETHIBOND NAB CT1 #1 30IN (SUTURE) ×1 IMPLANT
SUT MNCRL AB 4-0 PS2 18 (SUTURE) ×1 IMPLANT
SUT STRATAFIX 0 PDS 27 VIOLET (SUTURE) ×1
SUT VIC AB 2-0 CT1 27 (SUTURE) ×2
SUT VIC AB 2-0 CT1 TAPERPNT 27 (SUTURE) ×2 IMPLANT
SUTURE STRATFX 0 PDS 27 VIOLET (SUTURE) ×1 IMPLANT
TRAY FOLEY MTR SLVR 16FR STAT (SET/KITS/TRAYS/PACK) ×1 IMPLANT
TUBE SUCTION HIGH CAP CLEAR NV (SUCTIONS) ×1 IMPLANT

## 2022-09-17 NOTE — Evaluation (Signed)
Physical Therapy Evaluation Patient Details Name: Breanna Werner MRN: [id number] DOB: 1965-05-21 Today's Date: 09/17/2022  History of Present Illness  57 yo female presents to therapy s/p L THA, anterior approach on 09/17/2022 due to failure of conservative measures. Pt PMH includes but is not limited to: arthritis and depression.  Clinical Impression    Breanna Werner is a 57 y.o. female POD 0 s/p L THA. Patient reports IND with mobility at baseline. Patient is now limited by functional impairments (see PT problem list below) and requires CGA and min cues for transfers and gait with RW. Patient was able to ambulate 60 feet x 2  with RW and CGA progressing to S and cues for safe walker management. Patient educated on safe sequencing for stair mobility, fall risk prevention, use of RW, pain management and goal, CP/ice, car transfers pt and spouse verbalized and demonstrated understanding of safe guarding position for people assisting with mobility. Patient instructed in exercises to facilitate ROM and circulation reviewed and provided. Patient will benefit from continued skilled PT interventions to address impairments and progress towards PLOF. Patient has met mobility goals at adequate level for discharge home with family support and HEP; will continue to follow if pt continues acute stay to progress towards Mod I goals.       If plan is discharge home, recommend the following: A little help with walking and/or transfers;A little help with bathing/dressing/bathroom;Assistance with cooking/housework;Assist for transportation;Help with stairs or ramp for entrance   Can travel by private vehicle        Equipment Recommendations Rolling walker (2 wheels) (provided and adjusted at eval)  Recommendations for Other Services       Functional Status Assessment Patient has had a recent decline in their functional status and demonstrates the ability to make significant improvements in function in a reasonable  and predictable amount of time.     Precautions / Restrictions Precautions Precautions: Fall Restrictions Weight Bearing Restrictions: No      Mobility  Bed Mobility Overal bed mobility: Needs Assistance Bed Mobility: Supine to Sit     Supine to sit: Supervision     General bed mobility comments: min cues    Transfers Overall transfer level: Needs assistance Equipment used: Rolling walker (2 wheels) Transfers: Sit to/from Stand Sit to Stand: Contact guard assist           General transfer comment: cues for proper UE and AD placement    Ambulation/Gait Ambulation/Gait assistance: Contact guard assist Gait Distance (Feet): 60 Feet Assistive device: Rolling walker (2 wheels) Gait Pattern/deviations: Step-to pattern Gait velocity: decreased     General Gait Details: reliance on B UE support at RW for offloading LLE in stance phase  Stairs Stairs: Yes Stairs assistance: Contact guard assist Stair Management: Two rails Number of Stairs: 4 General stair comments: 2 steps assessed with B handrails and cues provided for proper sequencing and technique and pt able to progress to step navigation with spouse providing HHA and CGA with use of gait belt and cues for transitioning RW  Wheelchair Mobility     Tilt Bed    Modified Rankin (Stroke Patients Only)       Balance Overall balance assessment: Needs assistance Sitting-balance support: Feet supported Sitting balance-Leahy Scale: Good     Standing balance support: Bilateral upper extremity supported, During functional activity, Reliant on assistive device for balance Standing balance-Leahy Scale: Poor  Pertinent Vitals/Pain Pain Assessment Pain Assessment: 0-10 Pain Score: 3  Pain Location: L hip Pain Descriptors / Indicators: Aching, Discomfort (uncomfortable) Pain Intervention(s): Limited activity within patient's tolerance, Monitored during session,  Premedicated before session, Repositioned, Ice applied    Home Living Family/patient expects to be discharged to:: Private residence Living Arrangements: Spouse/significant other Available Help at Discharge: Family Type of Home: House Home Access: Stairs to enter Entrance Stairs-Rails: None Entrance Stairs-Number of Steps: 4   Home Layout: One level Home Equipment: None      Prior Function Prior Level of Function : Independent/Modified Independent;Working/employed;Driving             Mobility Comments: IND no AD for all ADLs, self care tasks       Extremity/Trunk Assessment        Lower Extremity Assessment Lower Extremity Assessment: LLE deficits/detail LLE Deficits / Details: ankle DF/PF 5/5 LLE Sensation: WNL    Cervical / Trunk Assessment Cervical / Trunk Assessment:  (wfl)  Communication   Communication Communication: No apparent difficulties  Cognition Arousal: Alert Behavior During Therapy: WFL for tasks assessed/performed Overall Cognitive Status: Within Functional Limits for tasks assessed                                          General Comments      Exercises Total Joint Exercises Ankle Circles/Pumps: AROM, Both, 20 reps Quad Sets: AROM, Left, 5 reps Heel Slides: AROM, Left, 5 reps Hip ABduction/ADduction: AROM, Left, 5 reps, Standing Long Arc Quad: AROM, Left, 5 reps, Seated Knee Flexion: AROM, Left, 5 reps, Standing Standing Hip Extension: AROM, Left, 5 reps   Assessment/Plan    PT Assessment Patient needs continued PT services  PT Problem List Decreased strength;Decreased activity tolerance;Decreased balance;Decreased mobility;Decreased coordination;Pain       PT Treatment Interventions DME instruction;Gait training;Stair training;Functional mobility training;Therapeutic activities;Therapeutic exercise;Balance training;Neuromuscular re-education;Patient/family education;Modalities    PT Goals (Current goals can be  found in the Care Plan section)  Acute Rehab PT Goals Patient Stated Goal: to be able to get back moving and to be able to lift and play with grandchildren when I do have them PT Goal Formulation: With patient Time For Goal Achievement: 10/01/22 Potential to Achieve Goals: Good    Frequency 7X/week     Co-evaluation               AM-PAC PT "6 Clicks" Mobility  Outcome Measure Help needed turning from your back to your side while in a flat bed without using bedrails?: None Help needed moving from lying on your back to sitting on the side of a flat bed without using bedrails?: A Little Help needed moving to and from a bed to a chair (including a wheelchair)?: A Little Help needed standing up from a chair using your arms (e.g., wheelchair or bedside chair)?: A Little Help needed to walk in hospital room?: A Little Help needed climbing 3-5 steps with a railing? : A Little 6 Click Score: 19    End of Session Equipment Utilized During Treatment: Gait belt Activity Tolerance: Patient tolerated treatment well;No increased pain Patient left: in chair;with family/visitor present Nurse Communication: Mobility status;Other (comment) (pt readiness for d/c from PT standpoint) PT Visit Diagnosis: Unsteadiness on feet (R26.81);Other abnormalities of gait and mobility (R26.89);Muscle weakness (generalized) (M62.81);Pain;Difficulty in walking, not elsewhere classified (R26.2) Pain - Right/Left: Left Pain - part of  body: Hip    Time: 4098-1191 PT Time Calculation (min) (ACUTE ONLY): 43 min   Charges:   PT Evaluation $PT Eval Low Complexity: 1 Low PT Treatments $Gait Training: 8-22 mins $Therapeutic Exercise: 8-22 mins PT General Charges $$ ACUTE PT VISIT: 1 Visit         Johnny Bridge, PT Acute Rehab   Jacqualyn Posey 09/17/2022, 3:54 PM

## 2022-09-17 NOTE — Discharge Instructions (Addendum)
Frank Aluisio, MD Total Joint Specialist [facility] [address] [place], [place] [address] [phone]  ANTERIOR APPROACH TOTAL HIP REPLACEMENT POSTOPERATIVE DIRECTIONS     Hip Rehabilitation, Guidelines Following Surgery  The results of a hip operation are greatly improved after range of motion and muscle strengthening exercises. Follow all safety measures which are given to protect your hip. If any of these exercises cause increased pain or swelling in your joint, decrease the amount until you are comfortable again. Then slowly increase the exercises. Call your caregiver if you have problems or questions.   BLOOD CLOT PREVENTION Take an 81 mg Aspirin two times a day for three weeks following surgery. Then take an 81 mg Aspirin once a day for three weeks. Then discontinue Aspirin. You may resume your vitamins/supplements upon discharge from the hospital. Do not take any NSAIDs (Advil, Aleve, Ibuprofen, Meloxicam, etc.) until you are 3 weeks out from surgery  HOME CARE INSTRUCTIONS  Remove items at home which could result in a fall. This includes throw rugs or furniture in walking pathways.  ICE to the affected hip as frequently as 20-30 minutes an hour and then as needed for pain and swelling. Continue to use ice on the hip for pain and swelling from surgery. You may notice swelling that will progress down to the foot and ankle. This is normal after surgery. Elevate the leg when you are not up walking on it.   Continue to use the breathing machine which will help keep your temperature down.  It is common for your temperature to cycle up and down following surgery, especially at night when you are not up moving around and exerting yourself.  The breathing machine keeps your lungs expanded and your temperature down.  DIET You may resume your previous home diet once your are discharged from the hospital.  DRESSING / WOUND CARE / SHOWERING You have an  adhesive waterproof bandage over the incision. Leave this in place until your first follow-up appointment. Once you remove this you will not need to place another bandage.  You may begin showering 3 days following surgery, but do not submerge the incision under water.  ACTIVITY For the first 3-5 days, it is important to rest and keep the operative leg elevated. You should, as a general rule, rest for 50 minutes and walk/stretch for 10 minutes per hour. After 5 days, you may slowly increase activity as tolerated.  Perform the exercises you were provided twice a day for about 15-20 minutes each session. Begin these 2 days following surgery. Walk with your walker as instructed. Use the walker until you are comfortable transitioning to a cane. Walk with the cane in the opposite hand of the operative leg. You may discontinue the cane once you are comfortable and walking steadily. Avoid periods of inactivity such as sitting longer than an hour when not asleep. This helps prevent blood clots.  Do not drive a car for 6 weeks or until released by your surgeon.  Do not drive while taking narcotics.  TED HOSE STOCKINGS Wear the elastic stockings on both legs for three weeks following surgery during the day. You may remove them at night while sleeping.  WEIGHT BEARING Weight bearing as tolerated with assist device (walker, cane, etc) as directed, use it as long as suggested by your surgeon or therapist, typically at least 4-6 weeks.  POSTOPERATIVE CONSTIPATION PROTOCOL Constipation - defined medically as fewer than three stools per week and severe constipation as   less than one stool per week.  One of the most common issues patients have following surgery is constipation.  Even if you have a regular bowel pattern at home, your normal regimen is likely to be disrupted due to multiple reasons following surgery.  Combination of anesthesia, postoperative narcotics, change in appetite and fluid intake all can  affect your bowels.  In order to avoid complications following surgery, here are some recommendations in order to help you during your recovery period.  Colace (docusate) - Pick up an over-the-counter form of Colace or another stool softener and take twice a day as long as you are requiring postoperative pain medications.  Take with a full glass of water daily.  If you experience loose stools or diarrhea, hold the colace until you stool forms back up.  If your symptoms do not get better within 1 week or if they get worse, check with your doctor. Dulcolax (bisacodyl) - Pick up over-the-counter and take as directed by the product packaging as needed to assist with the movement of your bowels.  Take with a full glass of water.  Use this product as needed if not relieved by Colace only.  MiraLax (polyethylene glycol) - Pick up over-the-counter to have on hand.  MiraLax is a solution that will increase the amount of water in your bowels to assist with bowel movements.  Take as directed and can mix with a glass of water, juice, soda, coffee, or tea.  Take if you go more than two days without a movement.Do not use MiraLax more than once per day. Call your doctor if you are still constipated or irregular after using this medication for 7 days in a row.  If you continue to have problems with postoperative constipation, please contact the office for further assistance and recommendations.  If you experience "the worst abdominal pain ever" or develop nausea or vomiting, please contact the office immediatly for further recommendations for treatment.  ITCHING  If you experience itching with your medications, try taking only a single pain pill, or even half a pain pill at a time.  You can also use Benadryl over the counter for itching or also to help with sleep.   MEDICATIONS See your medication summary on the "After Visit Summary" that the nursing staff will review with you prior to discharge.  You may have some home  medications which will be placed on hold until you complete the course of blood thinner medication.  It is important for you to complete the blood thinner medication as prescribed by your surgeon.  Continue your approved medications as instructed at time of discharge.  PRECAUTIONS If you experience chest pain or shortness of breath - call 911 immediately for transfer to the hospital emergency department.  If you develop a fever greater that 101 F, purulent drainage from wound, increased redness or drainage from wound, foul odor from the wound/dressing, or calf pain - CONTACT YOUR SURGEON.                                                   FOLLOW-UP APPOINTMENTS Make sure you keep all of your appointments after your operation with your surgeon and caregivers. You should call the office at the above phone number and make an appointment for approximately two weeks after the date of your surgery or on the   date instructed by your surgeon outlined in the "After Visit Summary".  RANGE OF MOTION AND STRENGTHENING EXERCISES  These exercises are designed to help you keep full movement of your hip joint. Follow your caregiver's or physical therapist's instructions. Perform all exercises about fifteen times, three times per day or as directed. Exercise both hips, even if you have had only one joint replacement. These exercises can be done on a training (exercise) mat, on the floor, on a table or on a bed. Use whatever works the best and is most comfortable for you. Use music or television while you are exercising so that the exercises are a pleasant break in your day. This will make your life better with the exercises acting as a break in routine you can look forward to.  Lying on your back, slowly slide your foot toward your buttocks, raising your knee up off the floor. Then slowly slide your foot back down until your leg is straight again.  Lying on your back spread your legs as far apart as you can without causing  discomfort.  Lying on your side, raise your upper leg and foot straight up from the floor as far as is comfortable. Slowly lower the leg and repeat.  Lying on your back, tighten up the muscle in the front of your thigh (quadriceps muscles). You can do this by keeping your leg straight and trying to raise your heel off the floor. This helps strengthen the largest muscle supporting your knee.  Lying on your back, tighten up the muscles of your buttocks both with the legs straight and with the knee bent at a comfortable angle while keeping your heel on the floor.   POST-OPERATIVE OPIOID TAPER INSTRUCTIONS: It is important to wean off of your opioid medication as soon as possible. If you do not need pain medication after your surgery it is ok to stop day one. Opioids include: Codeine, Hydrocodone(Norco, Vicodin), Oxycodone(Percocet, oxycontin) and hydromorphone amongst others.  Long term and even short term use of opiods can cause: Increased pain response Dependence Constipation Depression Respiratory depression And more.  Withdrawal symptoms can include Flu like symptoms Nausea, vomiting And more Techniques to manage these symptoms Hydrate well Eat regular healthy meals Stay active Use relaxation techniques(deep breathing, meditating, yoga) Do Not substitute Alcohol to help with tapering If you have been on opioids for less than two weeks and do not have pain than it is ok to stop all together.  Plan to wean off of opioids This plan should start within one week post op of your joint replacement. Maintain the same interval or time between taking each dose and first decrease the dose.  Cut the total daily intake of opioids by one tablet each day Next start to increase the time between doses. The last dose that should be eliminated is the evening dose.   IF YOU ARE TRANSFERRED TO A SKILLED REHAB FACILITY If the patient is transferred to a skilled rehab facility following release from the  hospital, a list of the current medications will be sent to the facility for the patient to continue.  When discharged from the skilled rehab facility, please have the facility set up the patient's Home Health Physical Therapy prior to being released. Also, the skilled facility will be responsible for providing the patient with their medications at time of release from the facility to include their pain medication, the muscle relaxants, and their blood thinner medication. If the patient is still at the rehab facility   at time of the two week follow up appointment, the skilled rehab facility will also need to assist the patient in arranging follow up appointment in our office and any transportation needs.  MAKE SURE YOU:  Understand these instructions.  Get help right away if you are not doing well or get worse.    DENTAL ANTIBIOTICS:  In most cases prophylactic antibiotics for Dental procdeures after total joint surgery are not necessary.  Exceptions are as follows:  1. History of prior total joint infection  2. Severely immunocompromised (Organ Transplant, cancer chemotherapy, Rheumatoid biologic meds such as Humera)  3. Poorly controlled diabetes (A1C &gt; 8.0, blood glucose over 200)  If you have one of these conditions, contact your surgeon for an antibiotic prescription, prior to your dental procedure.    Pick up stool softner and laxative for home use following surgery while on pain medications. Do not submerge incision under water. Please use good hand washing techniques while changing dressing each day. May shower starting three days after surgery. Please use a clean towel to pat the incision dry following showers. Continue to use ice for pain and swelling after surgery. Do not use any lotions or creams on the incision until instructed by your surgeon.  

## 2022-09-17 NOTE — Transfer of Care (Signed)
Immediate Anesthesia Transfer of Care Note  Patient: Breanna Werner  Procedure(s) Performed: TOTAL HIP ARTHROPLASTY ANTERIOR APPROACH (Left: Hip)  Patient Location: PACU  Anesthesia Type:Spinal  Level of Consciousness: drowsy  Airway & Oxygen Therapy: Patient Spontanous Breathing and Patient connected to nasal cannula oxygen  Post-op Assessment: Report given to RN and Post -op Vital signs reviewed and stable  Post vital signs: Reviewed and stable  Last Vitals:  Vitals Value Taken Time  BP 92/70 09/17/22 1005  Temp    Pulse 61 09/17/22 1007  Resp 11 09/17/22 1007  SpO2 100 % 09/17/22 1007  Vitals shown include unfiled device data.  Last Pain:  Vitals:   09/17/22 0718  TempSrc: Oral  PainSc:          Complications: No notable events documented.

## 2022-09-17 NOTE — Anesthesia Postprocedure Evaluation (Signed)
Anesthesia Post Note  Patient: Breanna Werner  Procedure(s) Performed: TOTAL HIP ARTHROPLASTY ANTERIOR APPROACH (Left: Hip)     Patient location during evaluation: PACU Anesthesia Type: Spinal Level of consciousness: awake Pain management: pain level controlled Vital Signs Assessment: post-procedure vital signs reviewed and stable Respiratory status: spontaneous breathing Cardiovascular status: stable Postop Assessment: no headache, no backache, spinal receding, patient able to bend at knees and no apparent nausea or vomiting Anesthetic complications: no  No notable events documented.  Last Vitals:  Vitals:   09/17/22 1051 09/17/22 1100  BP:  109/68  Pulse: (!) 48 (!) 57  Resp: 12 10  Temp:    SpO2: 99% 100%    Last Pain:  Vitals:   09/17/22 1051  TempSrc:   PainSc: 5                  [name] [address]

## 2022-09-17 NOTE — Interval H&P Note (Signed)
History and Physical Interval Note:  09/17/2022 6:45 AM  Breanna Werner Reason  has presented today for surgery, with the diagnosis of Left hip osteoarthritis.  The various methods of treatment have been discussed with the patient and family. After consideration of risks, benefits and other options for treatment, the patient has consented to  Procedure(s): TOTAL HIP ARTHROPLASTY ANTERIOR APPROACH (Left) as a surgical intervention.  The patient's history has been reviewed, patient examined, no change in status, stable for surgery.  I have reviewed the patient's chart and labs.  Questions were answered to the patient's satisfaction.     Homero Fellers Roberts Bon

## 2022-09-17 NOTE — Anesthesia Procedure Notes (Signed)
Spinal  Patient location during procedure: OR Start time: 09/17/2022 8:24 AM End time: 09/17/2022 8:28 AM Reason for block: surgical anesthesia Staffing Performed: anesthesiologist  Anesthesiologist: Leilani Able, MD Performed by: Leilani Able, MD Authorized by: Leilani Able, MD   Preanesthetic Checklist Completed: patient identified, IV checked, site marked, risks and benefits discussed, surgical consent, monitors and equipment checked, pre-op evaluation and timeout performed Spinal Block Patient position: sitting Prep: DuraPrep and site prepped and draped Patient monitoring: continuous pulse ox and blood pressure Approach: midline Location: L3-4 Injection technique: single-shot Needle Needle type: Pencan  Needle gauge: 24 G Needle length: 10 cm Needle insertion depth: 5 cm Assessment Sensory level: T8 Events: CSF return

## 2022-09-17 NOTE — Op Note (Signed)
OPERATIVE REPORT- TOTAL HIP ARTHROPLASTY   PREOPERATIVE DIAGNOSIS: Osteoarthritis of the Left hip.   POSTOPERATIVE DIAGNOSIS: Osteoarthritis of the Left  hip.   PROCEDURE: Left total hip arthroplasty, anterior approach.   SURGEON: Ollen Gross, MD   ASSISTANT: Arther Abbott, PA-C  ANESTHESIA:  Spinal  ESTIMATED BLOOD LOSS:-500 mL    DRAINS: None  COMPLICATIONS: None   CONDITION: PACU - hemodynamically stable.   BRIEF CLINICAL NOTE: Breanna Werner is a 57 y.o. female who has advanced end-  stage arthritis of their Left  hip with progressively worsening pain and  dysfunction.The patient has failed nonoperative management and presents for  total hip arthroplasty.   PROCEDURE IN DETAIL: After successful administration of spinal  anesthetic, the traction boots for the [facility] bed were placed on both  feet and the patient was placed onto the [facility] bed, boots placed into the leg  holders. The Left hip was then isolated from the perineum with plastic  drapes and prepped and draped in the usual sterile fashion. ASIS and  greater trochanter were marked and a oblique incision was made, starting  at about 1 cm lateral and 2 cm distal to the ASIS and coursing towards  the anterior cortex of the femur. The skin was cut with a 10 blade  through subcutaneous tissue to the level of the fascia overlying the  tensor fascia lata muscle. The fascia was then incised in line with the  incision at the junction of the anterior third and posterior 2/3rd. The  muscle was teased off the fascia and then the interval between the TFL  and the rectus was developed. The Hohmann retractor was then placed at  the top of the femoral neck over the capsule. The vessels overlying the  capsule were cauterized and the fat on top of the capsule was removed.  A Hohmann retractor was then placed anterior underneath the rectus  femoris to give exposure to the entire anterior capsule. A T-shaped  capsulotomy  was performed. The edges were tagged and the femoral head  was identified.       Osteophytes are removed off the superior acetabulum.  The femoral neck was then cut in situ with an oscillating saw. Traction  was then applied to the left lower extremity utilizing the [facility]  traction. The femoral head was then removed. Retractors were placed  around the acetabulum and then circumferential removal of the labrum was  performed. Osteophytes were also removed. Reaming starts at 45 mm to  medialize and  Increased in 2 mm increments to 49 mm. We reamed in  approximately 40 degrees of abduction, 20 degrees anteversion. A 50 mm  pinnacle acetabular shell was then impacted in anatomic position under  fluoroscopic guidance with excellent purchase. We did not need to place  any additional dome screws. A 32 mm neutral + 4 marathon liner was then  placed into the acetabular shell.       The femoral lift was then placed along the lateral aspect of the femur  just distal to the vastus ridge. The leg was  externally rotated and capsule  was stripped off the inferior aspect of the femoral neck down to the  level of the lesser trochanter, this was done with electrocautery. The femur was lifted after this was performed. The  leg was then placed in an extended and adducted position essentially delivering the femur. We also removed the capsule superiorly and the piriformis from the piriformis fossa to  gain excellent exposure of the  proximal femur. Rongeur was used to remove some cancellous bone to get  into the lateral portion of the proximal femur for placement of the  initial starter reamer. The starter broaches was placed  the starter broach  and was shown to go down the center of the canal. Broaching  with the Actis system was then performed starting at size 0  coursing  Up to size 5. A size 5 had excellent torsional and rotational  and axial stability. The trial standard offset neck was then placed  with a 32  + 9 trial head. The hip was then reduced. We confirmed that  the stem was in the canal both on AP and lateral x-rays. It also has excellent sizing. The hip was reduced with outstanding stability through full extension and full external rotation.. AP pelvis was taken and the leg lengths were measured and found to be equal. Hip was then dislocated again and the femoral head and neck removed. The  femoral broach was removed. Size 5 Actis stem with a standard offset  neck was then impacted into the femur following native anteversion. Has  excellent purchase in the canal. Excellent torsional and rotational and  axial stability. It is confirmed to be in the canal on AP and lateral  fluoroscopic views. The 32 + 9 ceramic head was placed and the hip  reduced with outstanding stability. Again AP pelvis was taken and it  confirmed that the leg lengths were equal. The wound was then copiously  irrigated with saline solution and the capsule reattached and repaired  with Ethibond suture. 30 ml of .25% Bupivicaine was  injected into the capsule and into the edge of the tensor fascia lata as well as subcutaneous tissue. The fascia overlying the tensor fascia lata was then closed with a running #1 V-Loc. Subcu was closed with interrupted 2-0 Vicryl and subcuticular running 4-0 Monocryl. Incision was cleaned  and dried. Steri-Strips and a bulky sterile dressing applied. The patient was awakened and transported to  recovery in stable condition.        Please note that a surgical assistant was a medical necessity for this procedure to perform it in a safe and expeditious manner. Assistant was necessary to provide appropriate retraction of vital neurovascular structures and to prevent femoral fracture and allow for anatomic placement of the prosthesis.  Ollen Gross, M.D.

## 2022-09-18 ENCOUNTER — Encounter (HOSPITAL_COMMUNITY): Payer: Self-pay | Admitting: Orthopedic Surgery

## 2022-09-22 ENCOUNTER — Other Ambulatory Visit (HOSPITAL_COMMUNITY): Payer: Self-pay

## 2022-09-30 ENCOUNTER — Other Ambulatory Visit (HOSPITAL_COMMUNITY): Payer: Self-pay

## 2022-09-30 MED ORDER — METHOCARBAMOL 500 MG PO TABS
500.0000 mg | ORAL_TABLET | Freq: Three times a day (TID) | ORAL | 0 refills | Status: AC | PRN
Start: 2022-09-30 — End: ?
  Filled 2022-09-30 – 2022-10-01 (×2): qty 30, 10d supply, fill #0

## 2022-10-01 ENCOUNTER — Other Ambulatory Visit (HOSPITAL_COMMUNITY): Payer: Self-pay

## 2022-10-01 ENCOUNTER — Other Ambulatory Visit (HOSPITAL_BASED_OUTPATIENT_CLINIC_OR_DEPARTMENT_OTHER): Payer: Self-pay

## 2022-10-23 DIAGNOSIS — R351 Nocturia: Secondary | ICD-10-CM | POA: Diagnosis not present

## 2022-10-23 DIAGNOSIS — Z1389 Encounter for screening for other disorder: Secondary | ICD-10-CM | POA: Diagnosis not present

## 2022-10-23 DIAGNOSIS — M791 Myalgia, unspecified site: Secondary | ICD-10-CM | POA: Diagnosis not present

## 2022-10-23 DIAGNOSIS — R3589 Other polyuria: Secondary | ICD-10-CM | POA: Diagnosis not present

## 2022-10-23 DIAGNOSIS — R7989 Other specified abnormal findings of blood chemistry: Secondary | ICD-10-CM | POA: Diagnosis not present

## 2022-10-28 DIAGNOSIS — Z96642 Presence of left artificial hip joint: Secondary | ICD-10-CM | POA: Diagnosis not present

## 2022-10-28 DIAGNOSIS — M1611 Unilateral primary osteoarthritis, right hip: Secondary | ICD-10-CM | POA: Diagnosis not present

## 2022-10-28 DIAGNOSIS — Z471 Aftercare following joint replacement surgery: Secondary | ICD-10-CM | POA: Diagnosis not present

## 2022-11-12 ENCOUNTER — Other Ambulatory Visit (HOSPITAL_COMMUNITY): Payer: Self-pay

## 2022-11-13 ENCOUNTER — Other Ambulatory Visit (HOSPITAL_COMMUNITY): Payer: Self-pay

## 2022-11-13 MED ORDER — ESCITALOPRAM OXALATE 10 MG PO TABS
10.0000 mg | ORAL_TABLET | Freq: Every day | ORAL | 3 refills | Status: DC
  Filled 2022-11-13: qty 90, 90d supply, fill #0
  Filled 2023-04-16: qty 90, 90d supply, fill #1
  Filled 2023-08-08 – 2023-08-21 (×2): qty 90, 90d supply, fill #2

## 2022-12-18 DIAGNOSIS — Z0000 Encounter for general adult medical examination without abnormal findings: Secondary | ICD-10-CM | POA: Diagnosis not present

## 2022-12-18 DIAGNOSIS — Z1389 Encounter for screening for other disorder: Secondary | ICD-10-CM | POA: Diagnosis not present

## 2022-12-19 DIAGNOSIS — M810 Age-related osteoporosis without current pathological fracture: Secondary | ICD-10-CM | POA: Diagnosis not present

## 2022-12-19 DIAGNOSIS — M25552 Pain in left hip: Secondary | ICD-10-CM | POA: Diagnosis not present

## 2022-12-19 DIAGNOSIS — F329 Major depressive disorder, single episode, unspecified: Secondary | ICD-10-CM | POA: Diagnosis not present

## 2022-12-19 DIAGNOSIS — N3281 Overactive bladder: Secondary | ICD-10-CM | POA: Diagnosis not present

## 2022-12-19 DIAGNOSIS — N951 Menopausal and female climacteric states: Secondary | ICD-10-CM | POA: Diagnosis not present

## 2022-12-19 DIAGNOSIS — K5900 Constipation, unspecified: Secondary | ICD-10-CM | POA: Diagnosis not present

## 2022-12-19 DIAGNOSIS — J309 Allergic rhinitis, unspecified: Secondary | ICD-10-CM | POA: Diagnosis not present

## 2022-12-19 DIAGNOSIS — I700 Atherosclerosis of aorta: Secondary | ICD-10-CM | POA: Diagnosis not present

## 2022-12-19 DIAGNOSIS — E785 Hyperlipidemia, unspecified: Secondary | ICD-10-CM | POA: Diagnosis not present

## 2022-12-19 DIAGNOSIS — G4700 Insomnia, unspecified: Secondary | ICD-10-CM | POA: Diagnosis not present

## 2023-01-28 ENCOUNTER — Ambulatory Visit (HOSPITAL_COMMUNITY): Admit: 2023-01-28 | Payer: 59 | Admitting: Orthopedic Surgery

## 2023-01-28 DIAGNOSIS — Z800 Family history of malignant neoplasm of digestive organs: Secondary | ICD-10-CM | POA: Diagnosis not present

## 2023-01-28 DIAGNOSIS — K5909 Other constipation: Secondary | ICD-10-CM | POA: Diagnosis not present

## 2023-01-28 DIAGNOSIS — Z860100 Personal history of colon polyps, unspecified: Secondary | ICD-10-CM | POA: Diagnosis not present

## 2023-01-28 SURGERY — ARTHROPLASTY, HIP, TOTAL, ANTERIOR APPROACH
Anesthesia: Choice | Site: Hip | Laterality: Right

## 2023-04-16 ENCOUNTER — Other Ambulatory Visit: Payer: Self-pay

## 2023-04-16 ENCOUNTER — Other Ambulatory Visit (HOSPITAL_COMMUNITY): Payer: Self-pay

## 2023-04-17 ENCOUNTER — Other Ambulatory Visit (HOSPITAL_COMMUNITY): Payer: Self-pay

## 2023-04-17 ENCOUNTER — Other Ambulatory Visit: Payer: Self-pay

## 2023-04-17 MED ORDER — EZETIMIBE 10 MG PO TABS
10.0000 mg | ORAL_TABLET | Freq: Every day | ORAL | 3 refills | Status: AC
  Filled 2023-04-17: qty 90, 90d supply, fill #0
  Filled 2023-08-08 – 2023-08-21 (×2): qty 90, 90d supply, fill #1
  Filled 2023-12-22: qty 90, 90d supply, fill #2

## 2023-04-20 ENCOUNTER — Other Ambulatory Visit (HOSPITAL_COMMUNITY): Payer: Self-pay

## 2023-07-02 ENCOUNTER — Other Ambulatory Visit (HOSPITAL_COMMUNITY): Payer: Self-pay

## 2023-07-02 MED ORDER — DULCOLAX 5 MG PO TBEC
10.0000 mg | DELAYED_RELEASE_TABLET | Freq: Two times a day (BID) | ORAL | 0 refills | Status: AC
Start: 2023-07-02 — End: ?
  Filled 2023-07-02 – 2023-07-29 (×2): qty 4, 1d supply, fill #0

## 2023-07-02 MED ORDER — PEG 3350-KCL-NA BICARB-NACL 420 G PO SOLR
ORAL | 0 refills | Status: AC
Start: 2023-07-02 — End: ?
  Filled 2023-07-02: qty 4000, 2d supply, fill #0
  Filled 2023-07-29: qty 4000, 1d supply, fill #0

## 2023-07-14 ENCOUNTER — Other Ambulatory Visit (HOSPITAL_COMMUNITY): Payer: Self-pay

## 2023-07-29 ENCOUNTER — Other Ambulatory Visit (HOSPITAL_COMMUNITY): Payer: Self-pay

## 2023-08-05 DIAGNOSIS — Z800 Family history of malignant neoplasm of digestive organs: Secondary | ICD-10-CM | POA: Diagnosis not present

## 2023-08-05 DIAGNOSIS — Z860101 Personal history of adenomatous and serrated colon polyps: Secondary | ICD-10-CM | POA: Diagnosis not present

## 2023-08-05 DIAGNOSIS — Z09 Encounter for follow-up examination after completed treatment for conditions other than malignant neoplasm: Secondary | ICD-10-CM | POA: Diagnosis not present

## 2023-08-05 DIAGNOSIS — D122 Benign neoplasm of ascending colon: Secondary | ICD-10-CM | POA: Diagnosis not present

## 2023-08-08 ENCOUNTER — Other Ambulatory Visit (HOSPITAL_COMMUNITY): Payer: Self-pay

## 2023-08-10 ENCOUNTER — Other Ambulatory Visit (HOSPITAL_COMMUNITY): Payer: Self-pay

## 2023-08-10 MED ORDER — ROSUVASTATIN CALCIUM 10 MG PO TABS
10.0000 mg | ORAL_TABLET | Freq: Every day | ORAL | 3 refills | Status: AC
  Filled 2023-08-10 – 2023-08-21 (×2): qty 90, 90d supply, fill #0
  Filled 2023-12-22: qty 90, 90d supply, fill #1

## 2023-08-19 ENCOUNTER — Other Ambulatory Visit (HOSPITAL_COMMUNITY): Payer: Self-pay

## 2023-08-20 ENCOUNTER — Other Ambulatory Visit (HOSPITAL_COMMUNITY): Payer: Self-pay

## 2023-08-21 ENCOUNTER — Other Ambulatory Visit (HOSPITAL_COMMUNITY): Payer: Self-pay

## 2023-11-24 DIAGNOSIS — E785 Hyperlipidemia, unspecified: Secondary | ICD-10-CM | POA: Diagnosis not present

## 2023-11-24 DIAGNOSIS — Z1231 Encounter for screening mammogram for malignant neoplasm of breast: Secondary | ICD-10-CM | POA: Diagnosis not present

## 2023-11-24 DIAGNOSIS — Z1151 Encounter for screening for human papillomavirus (HPV): Secondary | ICD-10-CM | POA: Diagnosis not present

## 2023-11-24 DIAGNOSIS — Z6823 Body mass index (BMI) 23.0-23.9, adult: Secondary | ICD-10-CM | POA: Diagnosis not present

## 2023-11-24 DIAGNOSIS — Z79899 Other long term (current) drug therapy: Secondary | ICD-10-CM | POA: Diagnosis not present

## 2023-11-24 DIAGNOSIS — Z124 Encounter for screening for malignant neoplasm of cervix: Secondary | ICD-10-CM | POA: Diagnosis not present

## 2023-11-24 DIAGNOSIS — Z1212 Encounter for screening for malignant neoplasm of rectum: Secondary | ICD-10-CM | POA: Diagnosis not present

## 2023-11-24 DIAGNOSIS — Z01419 Encounter for gynecological examination (general) (routine) without abnormal findings: Secondary | ICD-10-CM | POA: Diagnosis not present

## 2023-11-24 DIAGNOSIS — Z0189 Encounter for other specified special examinations: Secondary | ICD-10-CM | POA: Diagnosis not present

## 2023-11-24 DIAGNOSIS — E559 Vitamin D deficiency, unspecified: Secondary | ICD-10-CM | POA: Diagnosis not present

## 2023-12-07 DIAGNOSIS — N390 Urinary tract infection, site not specified: Secondary | ICD-10-CM | POA: Diagnosis not present

## 2023-12-07 DIAGNOSIS — M810 Age-related osteoporosis without current pathological fracture: Secondary | ICD-10-CM | POA: Diagnosis not present

## 2023-12-07 DIAGNOSIS — E785 Hyperlipidemia, unspecified: Secondary | ICD-10-CM | POA: Diagnosis not present

## 2023-12-07 DIAGNOSIS — B009 Herpesviral infection, unspecified: Secondary | ICD-10-CM | POA: Diagnosis not present

## 2023-12-07 DIAGNOSIS — F329 Major depressive disorder, single episode, unspecified: Secondary | ICD-10-CM | POA: Diagnosis not present

## 2023-12-07 DIAGNOSIS — M25552 Pain in left hip: Secondary | ICD-10-CM | POA: Diagnosis not present

## 2023-12-07 DIAGNOSIS — Z0000 Encounter for general adult medical examination without abnormal findings: Secondary | ICD-10-CM | POA: Diagnosis not present

## 2023-12-07 DIAGNOSIS — I700 Atherosclerosis of aorta: Secondary | ICD-10-CM | POA: Diagnosis not present

## 2023-12-07 DIAGNOSIS — K5900 Constipation, unspecified: Secondary | ICD-10-CM | POA: Diagnosis not present

## 2023-12-07 DIAGNOSIS — I7300 Raynaud's syndrome without gangrene: Secondary | ICD-10-CM | POA: Diagnosis not present

## 2023-12-10 ENCOUNTER — Other Ambulatory Visit (HOSPITAL_COMMUNITY): Payer: Self-pay

## 2023-12-10 MED ORDER — FLUZONE 0.5 ML IM SUSY
0.5000 mL | PREFILLED_SYRINGE | Freq: Once | INTRAMUSCULAR | 0 refills | Status: AC
  Filled 2023-12-10: qty 0.5, 1d supply, fill #0

## 2023-12-22 ENCOUNTER — Other Ambulatory Visit (HOSPITAL_COMMUNITY): Payer: Self-pay

## 2023-12-22 MED ORDER — ESCITALOPRAM OXALATE 10 MG PO TABS
10.0000 mg | ORAL_TABLET | Freq: Every day | ORAL | 3 refills | Status: AC
  Filled 2023-12-22: qty 90, 90d supply, fill #0

## 2023-12-23 ENCOUNTER — Other Ambulatory Visit: Payer: Self-pay

## 2024-01-05 NOTE — H&P (Signed)
 TOTAL HIP ADMISSION H&P  Patient is admitted for right total hip arthroplasty.  Subjective:  Chief Complaint: Right hip pain  HPI: Breanna Werner, 58 y.o. female, has a history of pain and functional disability in the right hip due to arthritis and patient has failed non-surgical conservative treatments for greater than 12 weeks to include NSAID's and/or analgesics, flexibility and strengthening excercises, and activity modification. Onset of symptoms was gradual, starting serval years ago with gradually worsening course since that time. The patient noted no past surgery on the right hip. Patient currently rates pain in the right hip at 8 out of 10 with activity. Patient has worsening of pain with activity and weight bearing, pain that interfers with activities of daily living, and pain with passive range of motion. Patient has evidence of Radiographs AP pelvis and lateral left hip demonstrate the prosthesis in excellent position with no periprosthetic abnormalities. The right hip is now bone-on-bone. by imaging studies. This condition presents safety issues increasing the risk of falls. There is no current active infection.  There are no active problems to display for this patient.   Past Medical History:  Diagnosis Date   Arthritis    Depression     Past Surgical History:  Procedure Laterality Date   COLONOSCOPY     TOTAL HIP ARTHROPLASTY Left 09/17/2022   Procedure: TOTAL HIP ARTHROPLASTY ANTERIOR APPROACH;  Surgeon: Melodi Lerner, MD;  Location: [facility];  Service: Orthopedics;  Laterality: Left;    Prior to Admission medications   Medication Sig Start Date End Date Taking? Authorizing Provider  acetaminophen  (TYLENOL ) 650 MG CR tablet Take 1,300 mg by mouth 2 (two) times daily.    [provider]  alendronate  (FOSAMAX ) 70 MG tablet Take 1 tablet (70 mg total) by mouth once a week. 09/10/22     bisacodyl  (DULCOLAX) 5 MG EC tablet Take 2 tablets (10 mg total) by mouth 2 (two)  times daily for 1 day as directed 07/02/23     cholecalciferol (VITAMIN D3) 25 MCG (1000 UNIT) tablet Take 1,000 Units by mouth daily.    [provider]  COVID-19 At Home Antigen Test [facility] COVID-19 HOME TEST) KIT Use as directed 07/12/20   Roh, Jacqueline M, [facility]  escitalopram  (LEXAPRO ) 10 MG tablet Take 1 tablet (10 mg total) by mouth daily. 12/22/23     estradiol  (ESTRACE ) 0.1 MG/GM vaginal cream Insert 1 applicatorful vaginally twice a week at bedtime. 09/11/22     estradiol  (ESTRACE ) 0.1 MG/GM vaginal cream Insert 1 applicatorful every day at bedtime for 14 days. 09/10/22     ezetimibe  (ZETIA ) 10 MG tablet Take 1 tablet (10 mg total) by mouth daily. 04/17/23     fluticasone (FLONASE) 50 MCG/ACT nasal spray Place 2 sprays into both nostrils daily.    [provider]  methocarbamol  (ROBAXIN ) 500 MG tablet Take 1 tablet (500 mg total) by mouth every 6 (six) hours as needed for muscle spasms. 09/17/22   Edmisten, Roxie CROME, PA  methocarbamol  (ROBAXIN ) 500 MG tablet Take 1 tablet (500 mg total) by mouth 3 (three) times daily as needed for spasm. 09/30/22     ondansetron  (ZOFRAN ) 4 MG tablet Take 1 tablet (4 mg total) by mouth every 8 (eight) hours as needed for nausea or vomiting. 09/17/22   Edmisten, Kristie L, PA  oxyCODONE  (ROXICODONE ) 5 MG immediate release tablet Take 1-2 tablets (5-10 mg total) by mouth every 6 (six) hours as needed for severe pain. 09/17/22   Edmisten, Kristie  L, PA  polyethylene glycol-electrolytes (NULYTELY) 420 g solution Take as directed by your provider. 07/02/23     rosuvastatin  (CRESTOR ) 10 MG tablet Take 1 tablet (10 mg total) by mouth daily. 08/10/23     valACYclovir  (VALTREX ) 1000 MG tablet Take 2 tablets (2,000 mg total) by mouth at first sign of fever blister & 2 more tablets 12 hours later then stop as directed Patient not taking: Reported on 09/01/2022 06/20/21     valACYclovir  (VALTREX ) 1000 MG tablet Take 2 tablets by mouth at the first sign of fever  blister and take 2 more tablets 12 hours later then stop as directed. 07/14/22       No Known Allergies  Social History   Socioeconomic History   Marital status: Married    Spouse name: Not on file   Number of children: Not on file   Years of education: Not on file   Highest education level: Not on file  Occupational History   Not on file  Tobacco Use   Smoking status: Never   Smokeless tobacco: Never  Vaping Use   Vaping status: Never Used  Substance and Sexual Activity   Alcohol  use: Yes    Comment: ocas.   Drug use: Never   Sexual activity: Not on file  Other Topics Concern   Not on file  Social History Narrative   Not on file   Social Drivers of Health   Financial Resource Strain: Not on file  Food Insecurity: Not on file  Transportation Needs: Not on file  Physical Activity: Not on file  Stress: Not on file  Social Connections: Not on file  Intimate Partner Violence: Not on file    Tobacco Use: Low Risk  (09/17/2022)   Patient History    Smoking Tobacco Use: Never    Smokeless Tobacco Use: Never    Passive Exposure: Not on file   Social History   Substance and Sexual Activity  Alcohol  Use Yes   Comment: ocas.    Family History  Problem Relation Age of Onset   Breast cancer Neg Hx     Review of Systems  Constitutional:  Negative for chills and fever.  Respiratory:  Negative for cough.   Cardiovascular:  Negative for chest pain.  Gastrointestinal:  Negative for abdominal pain.  Genitourinary:  Negative for dysuria.  Musculoskeletal:  Positive for joint pain.     Objective:  Physical Exam: Right hip Can be flexed to 100 degrees, minimal internal rotation, external rotation to 20 degrees, abduction to 20 degrees. Antalgic gait pattern on the right.  Imaging Review Radiographs AP pelvis and lateral left hip demonstrate the prosthesis in excellent position with no periprosthetic abnormalities. The right hip is now  bone-on-bone.  Assessment/Plan:  End stage arthritis, right hip  The patient history, physical examination, clinical judgement of the provider and imaging studies are consistent with end stage degenerative joint disease of the right hip and total hip arthroplasty is deemed medically necessary. The treatment options including medical management, injection therapy, arthroscopy and arthroplasty were discussed at length. The risks and benefits of total hip arthroplasty were presented and reviewed. The risks due to aseptic loosening, infection, stiffness, dislocation/subluxation, thromboembolic complications and other imponderables were discussed. The patient acknowledged the explanation, agreed to proceed with the plan and consent was signed. Patient is being admitted for inpatient treatment for surgery, pain control, PT, OT, prophylactic antibiotics, VTE prophylaxis, progressive ambulation and ADLs and discharge planning.The patient is planning to be discharged Home.  Patient's anticipated LOS is less than 2 midnights, meeting these requirements: - Younger than 3 - Lives within 1 hour of care - Has a competent adult at home to recover with post-op recover - NO history of  - Chronic pain requiring opiods  - Diabetes  - Coronary Artery Disease  - Heart failure  - Heart attack  - Stroke  - DVT/VTE  - Cardiac arrhythmia  - Respiratory Failure/COPD  - Renal failure  - Anemia  - Advanced Liver disease  Therapy Plans: HEP  Disposition: Home with Husband  Planned DVT Prophylaxis: 81mg  Aspirin  BID  DME Needed: none  PCP: Oneil Neth, MD (clearance pending*  TXA: IV  Allergies: None  Metal Allergies: none  Anesthesia Concerns: none  BMI: 23.5  Last HgbA1c: not diabetic  Pharmacy: [facility] to bring on DC Pain regimen: Hydrocodone   - Patient was instructed on what medications to stop prior to surgery. - Follow-up visit in 2 weeks with Dr. Melodi - Begin physical therapy following  surgery - Pre-operative lab work as pre-surgical testing - Prescriptions will be provided in hospital at time of discharge  Waddell Sor, PA-C Orthopedic Surgery [facility] [place]

## 2024-01-14 NOTE — Progress Notes (Addendum)
 Date of COVID positive in last 90 days:  PCP - Oneil Neth, MD Cardiologist - n/a  Medical/cardiac clearance in media tab dated 12/18/23  Chest x-ray - N/A EKG - with PCP Stress Test - N/A ECHO - N/A Cardiac Cath - N/A Pacemaker/ICD device last checked:N/A Spinal Cord Stimulator:N/A  Bowel Prep - N/A  Sleep Study - N/A CPAP -   Fasting Blood Sugar - N/A Checks Blood Sugar _____ times a day  Last dose of GLP1 agonist-  N/A GLP1 instructions:  Do not take after     Last dose of SGLT-2 inhibitors-  N/A SGLT-2 instructions:  Do not take after     Blood Thinner Instructions: N/A Last dose:   Time: Aspirin  Instructions:N/A Last Dose:  Activity level: Can go up a flight of stairs and perform activities of daily living without stopping and without symptoms of chest pain or shortness of breath.   Anesthesia review:   Patient denies shortness of breath, fever, cough and chest pain at PAT appointment  Patient verbalized understanding of instructions that were given to them at the PAT appointment. Patient was also instructed that they will need to review over the PAT instructions again at home before surgery.

## 2024-01-14 NOTE — Patient Instructions (Addendum)
 SURGICAL WAITING ROOM VISITATION  Patients having surgery or a procedure may have no more than 2 support people in the waiting area - these visitors may rotate.    Children under the age of 61 must have an adult with them who is not the patient.  Visitors with respiratory illnesses are discouraged from visiting and should remain at home.  If the patient needs to stay at the hospital during part of their recovery, the visitor guidelines for inpatient rooms apply. Pre-op nurse will coordinate an appropriate time for 1 support person to accompany patient in pre-op.  This support person may not rotate.    Please refer to the [facility] website for the visitor guidelines for Inpatients (after your surgery is over and you are in a regular room).    Your procedure is scheduled on: 01/27/24   Report to [facility] Main Entrance    Report to admitting at 7:00 AM   Call this number if you have problems the morning of surgery [phone]   Do not eat food :After Midnight.   After Midnight you may have the following liquids until 6:30 AM DAY OF SURGERY  Water  Non-Citrus Juices (without pulp, NO RED-Apple, White grape, White cranberry) Black Coffee (NO MILK/CREAM OR CREAMERS, sugar ok)  Clear Tea (NO MILK/CREAM OR CREAMERS, sugar ok) regular and decaf                             Plain Jell-O (NO RED)                                           Fruit ices (not with fruit pulp, NO RED)                                     Popsicles (NO RED)                                                               Sports drinks like Gatorade (NO RED)   The day of surgery:  Drink ONE (1) Pre-Surgery Clear Ensure at 6:30 AM the morning of surgery. Drink in one sitting. Do not sip.  This drink was given to you during your hospital  pre-op appointment visit. Nothing else to drink after completing the  Pre-Surgery Clear Ensure.          If you have questions, please contact your surgeon's  office.   FOLLOW BOWEL PREP AND ANY ADDITIONAL PRE OP INSTRUCTIONS YOU RECEIVED FROM YOUR SURGEON'S OFFICE!!!     Oral Hygiene is also important to reduce your risk of infection.                                    Remember - BRUSH YOUR TEETH THE MORNING OF SURGERY WITH YOUR REGULAR TOOTHPASTE  DENTURES WILL BE REMOVED PRIOR TO SURGERY PLEASE DO NOT APPLY Poly grip OR ADHESIVES!!!   Stop all vitamins and herbal supplements 7 days before surgery.  Take these medicines the morning of surgery with A SIP OF WATER : Tylenol , Lexapro , Zetia , Allegra, Flonase, Rosuvastatin              You may not have any metal on your body including hair pins, jewelry, and body piercing             Do not wear make-up, lotions, powders, perfumes/cologne, or deodorant  Do not wear nail polish including gel and S&S, artificial/acrylic nails, or any other type of covering on natural nails including finger and toenails. If you have artificial nails, gel coating, etc. that needs to be removed by a nail salon please have this removed prior to surgery or surgery may need to be canceled/ delayed if the surgeon/ anesthesia feels like they are unable to be safely monitored.   Do not shave  48 hours prior to surgery.    Do not bring valuables to the hospital. [facility] IS NOT             RESPONSIBLE   FOR VALUABLES.  DO NOT BRING YOUR HOME MEDICATIONS TO THE HOSPITAL. PHARMACY WILL DISPENSE MEDICATIONS LISTED ON YOUR MEDICATION LIST TO YOU DURING YOUR ADMISSION IN THE HOSPITAL!    Patients discharged on the day of surgery will not be allowed to drive home.  Someone NEEDS to stay with you for the first 24 hours after anesthesia.   Special Instructions: Bring a copy of your healthcare power of attorney and living will documents the day of surgery if you haven't scanned them before.              Please read over the following fact sheets you were given: IF YOU HAVE QUESTIONS ABOUT YOUR PRE-OP INSTRUCTIONS PLEASE CALL  [phone].   If you received a COVID test during your pre-op visit  it is requested that you wear a mask when out in public, stay away from anyone that may not be feeling well and notify your surgeon if you develop symptoms. If you test positive for Covid or have been in contact with anyone that has tested positive in the last 10 days please notify you surgeon.      Pre-operative 4 CHG Bath Instructions  DYNA-Hex 4 Chlorhexidine  Gluconate 4% Solution Antiseptic 4 fl. oz   You can play a key role in reducing the risk of infection after surgery. Your skin needs to be as free of germs as possible. You can reduce the number of germs on your skin by washing with CHG (chlorhexidine  gluconate) soap before surgery. CHG is an antiseptic soap that kills germs and continues to kill germs even after washing.   DO NOT use if you have an allergy to chlorhexidine /CHG or antibacterial soaps. If your skin becomes reddened or irritated, stop using the CHG and notify one of our RNs at   Please shower with the CHG soap starting 4 days before surgery using the following schedule:     Please keep in mind the following:  DO NOT shave, including legs and underarms, starting the day of your first shower.   You may shave your face at any point before/day of surgery.  Place clean sheets on your bed the day you start using CHG soap. Use a clean washcloth (not used since being washed) for each shower. DO NOT sleep with pets once you start using the CHG.  CHG Shower Instructions:  If you choose to wash your hair and private area, wash first with your normal shampoo/soap.  After  you use shampoo/soap, rinse your hair and body thoroughly to remove shampoo/soap residue.  Turn the water  OFF and apply about 3 tablespoons (45 ml) of CHG soap to a CLEAN washcloth.  Apply CHG soap ONLY FROM YOUR NECK DOWN TO YOUR TOES (washing for 3-5 minutes)  DO NOT use CHG soap on face, private areas, open wounds, or sores.   Pay special attention to the area where your surgery is being performed.  If you are having back surgery, having someone wash your back for you may be helpful. Wait 2 minutes after CHG soap is applied, then you may rinse off the CHG soap.  Pat dry with a clean towel  Put on clean clothes/pajamas   If you choose to wear lotion, please use ONLY the CHG-compatible lotions on the back of this paper.     Additional instructions for the day of surgery: DO NOT APPLY any lotions, deodorants, cologne, or perfumes.   Put on clean/comfortable clothes.  Brush your teeth.  Ask your nurse before applying any prescription medications to the skin.   CHG Compatible Lotions   Aveeno Moisturizing lotion  Cetaphil Moisturizing Cream  Cetaphil Moisturizing Lotion  Clairol Herbal Essence Moisturizing Lotion, Dry Skin  Clairol Herbal Essence Moisturizing Lotion, Extra Dry Skin  Clairol Herbal Essence Moisturizing Lotion, Normal Skin  Curel Age Defying Therapeutic Moisturizing Lotion with Alpha Hydroxy  Curel Extreme Care Body Lotion  Curel Soothing Hands Moisturizing Hand Lotion  Curel Therapeutic Moisturizing Cream, Fragrance-Free  Curel Therapeutic Moisturizing Lotion, Fragrance-Free  Curel Therapeutic Moisturizing Lotion, Original Formula  Eucerin Daily Replenishing Lotion  Eucerin Dry Skin Therapy Plus Alpha Hydroxy Crme  Eucerin Dry Skin Therapy Plus Alpha Hydroxy Lotion  Eucerin Original Crme  Eucerin Original Lotion  Eucerin Plus Crme Eucerin Plus Lotion  Eucerin TriLipid Replenishing Lotion  Keri Anti-Bacterial Hand Lotion  Keri Deep Conditioning Original Lotion Dry Skin Formula Softly Scented  Keri Deep Conditioning Original Lotion, Fragrance Free Sensitive Skin Formula  Keri Lotion Fast Absorbing Fragrance Free Sensitive Skin Formula  Keri Lotion Fast Absorbing Softly Scented Dry Skin Formula  Keri Original Lotion  Keri Skin Renewal Lotion Keri Silky Smooth Lotion  Keri Silky Smooth  Sensitive Skin Lotion  Nivea Body Creamy Conditioning Oil  Nivea Body Extra Enriched Lotion  Nivea Body Original Lotion  Nivea Body Sheer Moisturizing Lotion Nivea Crme  Nivea Skin Firming Lotion  NutraDerm 30 Skin Lotion  NutraDerm Skin Lotion  NutraDerm Therapeutic Skin Cream  NutraDerm Therapeutic Skin Lotion  ProShield Protective Hand Cream  Provon moisturizing lotion  View Pre-Surgery Education Videos:  [email or web address]     Incentive Spirometer  An incentive spirometer is a tool that can help keep your lungs clear and active. This tool measures how well you are filling your lungs with each breath. Taking long deep breaths may help reverse or decrease the chance of developing breathing (pulmonary) problems (especially infection) following: A long period of time when you are unable to move or be active. BEFORE THE PROCEDURE  If the spirometer includes an indicator to show your best effort, your nurse or respiratory therapist will set it to a desired goal. If possible, sit up straight or lean slightly forward. Try not to slouch. Hold the incentive spirometer in an upright position. INSTRUCTIONS FOR USE  Sit on the edge of your bed if possible, or sit up as far as you can in bed or on a chair. Hold the incentive spirometer in an upright position. Breathe out normally. Place the  mouthpiece in your mouth and seal your lips tightly around it. Breathe in slowly and as deeply as possible, raising the piston or the ball toward the top of the column. Hold your breath for 3-5 seconds or for as long as possible. Allow the piston or ball to fall to the bottom of the column. Remove the mouthpiece from your mouth and breathe out normally. Rest for a few seconds and repeat Steps 1 through 7 at least 10 times every 1-2 hours when you are awake. Take your time and take a few normal breaths between deep breaths. The spirometer may  include an indicator to show your best effort. Use the indicator as a goal to work toward during each repetition. After each set of 10 deep breaths, practice coughing to be sure your lungs are clear. If you have an incision (the cut made at the time of surgery), support your incision when coughing by placing a pillow or rolled up towels firmly against it. Once you are able to get out of bed, walk around indoors and cough well. You may stop using the incentive spirometer when instructed by your caregiver.  RISKS AND COMPLICATIONS Take your time so you do not get dizzy or light-headed. If you are in pain, you may need to take or ask for pain medication before doing incentive spirometry. It is harder to take a deep breath if you are having pain. AFTER USE Rest and breathe slowly and easily. It can be helpful to keep track of a log of your progress. Your caregiver can provide you with a simple table to help with this. If you are using the spirometer at home, follow these instructions: SEEK MEDICAL CARE IF:  You are having difficultly using the spirometer. You have trouble using the spirometer as often as instructed. Your pain medication is not giving enough relief while using the spirometer. You develop fever of 100.5 F (38.1 C) or higher. SEEK IMMEDIATE MEDICAL CARE IF:  You cough up bloody sputum that had not been present before. You develop fever of 102 F (38.9 C) or greater. You develop worsening pain at or near the incision site. MAKE SURE YOU:  Understand these instructions. Will watch your condition. Will get help right away if you are not doing well or get worse. Document Released: 06/09/2006 Document Revised: 04/21/2011 Document Reviewed: 08/10/2006 Exit[employer] Patient Information 2014 [employer], [place].   ________________________________________________________________________ WHAT IS A BLOOD TRANSFUSION? Blood Transfusion Information  A transfusion is the replacement of blood or  some of its parts. Blood is made up of multiple cells which provide different functions. Red blood cells carry oxygen and are used for blood loss replacement. White blood cells fight against infection. Platelets control bleeding. Plasma helps clot blood. Other blood products are available for specialized needs, such as hemophilia or other clotting disorders. BEFORE THE TRANSFUSION  Who gives blood for transfusions?  Healthy volunteers who are fully evaluated to make sure their blood is safe. This is blood bank blood. Transfusion therapy is the safest it has ever been in the practice of medicine. Before blood is taken from a donor, a complete history is taken to make sure that person has no history of diseases nor engages in risky social behavior (examples are intravenous drug use or sexual activity with multiple partners). The donor's travel history is screened to minimize risk of transmitting infections, such as malaria. The donated blood is tested for signs of infectious diseases, such as HIV and hepatitis. The blood is then  tested to be sure it is compatible with you in order to minimize the chance of a transfusion reaction. If you or a relative donates blood, this is often done in anticipation of surgery and is not appropriate for emergency situations. It takes many days to process the donated blood. RISKS AND COMPLICATIONS Although transfusion therapy is very safe and saves many lives, the main dangers of transfusion include:  Getting an infectious disease. Developing a transfusion reaction. This is an allergic reaction to something in the blood you were given. Every precaution is taken to prevent this. The decision to have a blood transfusion has been considered carefully by your caregiver before blood is given. Blood is not given unless the benefits outweigh the risks. AFTER THE TRANSFUSION Right after receiving a blood transfusion, you will usually feel much better and more energetic. This is  especially true if your red blood cells have gotten low (anemic). The transfusion raises the level of the red blood cells which carry oxygen, and this usually causes an energy increase. The nurse administering the transfusion will monitor you carefully for complications. HOME CARE INSTRUCTIONS  No special instructions are needed after a transfusion. You may find your energy is better. Speak with your caregiver about any limitations on activity for underlying diseases you may have. SEEK MEDICAL CARE IF:  Your condition is not improving after your transfusion. You develop redness or irritation at the intravenous (IV) site. SEEK IMMEDIATE MEDICAL CARE IF:  Any of the following symptoms occur over the next 12 hours: Shaking chills. You have a temperature by mouth above 102 F (38.9 C), not controlled by medicine. Chest, back, or muscle pain. People around you feel you are not acting correctly or are confused. Shortness of breath or difficulty breathing. Dizziness and fainting. You get a rash or develop hives. You have a decrease in urine output. Your urine turns a dark color or changes to pink, red, or brown. Any of the following symptoms occur over the next 10 days: You have a temperature by mouth above 102 F (38.9 C), not controlled by medicine. Shortness of breath. Weakness after normal activity. The white part of the eye turns yellow (jaundice). You have a decrease in the amount of urine or are urinating less often. Your urine turns a dark color or changes to pink, red, or brown. Document Released: 01/25/2000 Document Revised: 04/21/2011 Document Reviewed: 09/13/2007 [facility] Patient Information 2014 [place], [place].  _______________________________________________________________________

## 2024-01-15 ENCOUNTER — Other Ambulatory Visit: Payer: Self-pay

## 2024-01-15 ENCOUNTER — Encounter (HOSPITAL_COMMUNITY)
Admission: RE | Admit: 2024-01-15 | Discharge: 2024-01-15 | Disposition: A | Source: Ambulatory Visit | Attending: Orthopedic Surgery

## 2024-01-15 ENCOUNTER — Encounter (HOSPITAL_COMMUNITY): Payer: Self-pay

## 2024-01-15 VITALS — BP 116/81 | HR 72 | Temp 97.7°F | Resp 16 | Ht 65.0 in | Wt 140.0 lb

## 2024-01-15 DIAGNOSIS — Z01818 Encounter for other preprocedural examination: Secondary | ICD-10-CM

## 2024-01-15 LAB — CBC
HCT: 44.2 % (ref 36.0–46.0)
Hemoglobin: 14.4 g/dL (ref 12.0–15.0)
MCH: 29.5 pg (ref 26.0–34.0)
MCHC: 32.6 g/dL (ref 30.0–36.0)
MCV: 90.6 fL (ref 80.0–100.0)
Platelets: 205 K/uL (ref 150–400)
RBC: 4.88 MIL/uL (ref 3.87–5.11)
RDW: 12.7 % (ref 11.5–15.5)
WBC: 5.8 K/uL (ref 4.0–10.5)
nRBC: 0 % (ref 0.0–0.2)

## 2024-01-15 LAB — TYPE AND SCREEN
ABO/RH(D): O POS
Antibody Screen: NEGATIVE

## 2024-01-15 LAB — SURGICAL PCR SCREEN
MRSA, PCR: NEGATIVE
Staphylococcus aureus: NEGATIVE

## 2024-01-27 ENCOUNTER — Ambulatory Visit (HOSPITAL_COMMUNITY): Admitting: Certified Registered Nurse Anesthetist

## 2024-01-27 ENCOUNTER — Ambulatory Visit (HOSPITAL_COMMUNITY)

## 2024-01-27 ENCOUNTER — Encounter (HOSPITAL_COMMUNITY): Admission: RE | Disposition: A | Payer: Self-pay | Source: Home / Self Care | Attending: Orthopedic Surgery

## 2024-01-27 ENCOUNTER — Other Ambulatory Visit (HOSPITAL_COMMUNITY): Payer: Self-pay

## 2024-01-27 ENCOUNTER — Encounter (HOSPITAL_COMMUNITY): Payer: Self-pay | Admitting: Orthopedic Surgery

## 2024-01-27 ENCOUNTER — Ambulatory Visit (HOSPITAL_COMMUNITY)
Admission: RE | Admit: 2024-01-27 | Discharge: 2024-01-27 | Disposition: A | Attending: Orthopedic Surgery | Admitting: Orthopedic Surgery

## 2024-01-27 DIAGNOSIS — Z96641 Presence of right artificial hip joint: Secondary | ICD-10-CM

## 2024-01-27 DIAGNOSIS — M1611 Unilateral primary osteoarthritis, right hip: Secondary | ICD-10-CM | POA: Diagnosis not present

## 2024-01-27 DIAGNOSIS — Z7182 Exercise counseling: Secondary | ICD-10-CM | POA: Insufficient documentation

## 2024-01-27 DIAGNOSIS — M25751 Osteophyte, right hip: Secondary | ICD-10-CM | POA: Insufficient documentation

## 2024-01-27 DIAGNOSIS — F32A Depression, unspecified: Secondary | ICD-10-CM | POA: Diagnosis not present

## 2024-01-27 HISTORY — PX: TOTAL HIP ARTHROPLASTY: SHX124

## 2024-01-27 SURGERY — ARTHROPLASTY, HIP, TOTAL, ANTERIOR APPROACH
Anesthesia: Spinal | Site: Hip | Laterality: Right

## 2024-01-27 MED ORDER — POVIDONE-IODINE 10 % EX SWAB
2.0000 | Freq: Once | CUTANEOUS | Status: AC
  Administered 2024-01-27: 08:00:00 2 via TOPICAL

## 2024-01-27 MED ORDER — BUPIVACAINE-EPINEPHRINE (PF) 0.25% -1:200000 IJ SOLN
INTRAMUSCULAR | Status: AC
  Filled 2024-01-27: qty 30

## 2024-01-27 MED ORDER — PHENYLEPHRINE HCL (PRESSORS) 10 MG/ML IV SOLN
INTRAVENOUS | Status: AC
  Filled 2024-01-27: qty 1

## 2024-01-27 MED ORDER — MIDAZOLAM HCL (PF) 2 MG/2ML IJ SOLN
INTRAMUSCULAR | Status: DC | PRN
  Administered 2024-01-27: 10:00:00 2 mg via INTRAVENOUS

## 2024-01-27 MED ORDER — ACETAMINOPHEN 10 MG/ML IV SOLN: 1000.0000 mg | Freq: Once | INTRAVENOUS | Status: DC | PRN

## 2024-01-27 MED ORDER — HYDROCODONE-ACETAMINOPHEN 7.5-325 MG PO TABS
1.0000 | ORAL_TABLET | ORAL | Status: DC | PRN
  Administered 2024-01-27: 13:00:00 1 via ORAL

## 2024-01-27 MED ORDER — CEFAZOLIN SODIUM-DEXTROSE 2-4 GM/100ML-% IV SOLN
2.0000 g | INTRAVENOUS | Status: AC
  Administered 2024-01-27: 10:00:00 2 g via INTRAVENOUS
  Filled 2024-01-27: qty 100

## 2024-01-27 MED ORDER — BUPIVACAINE-EPINEPHRINE (PF) 0.25% -1:200000 IJ SOLN
INTRAMUSCULAR | Status: DC | PRN
  Administered 2024-01-27: 10:00:00 30 mL

## 2024-01-27 MED ORDER — METHOCARBAMOL 1000 MG/10ML IJ SOLN: 500.0000 mg | Freq: Four times a day (QID) | INTRAMUSCULAR | Status: DC | PRN

## 2024-01-27 MED ORDER — MIDAZOLAM HCL 2 MG/2ML IJ SOLN
INTRAMUSCULAR | Status: AC
  Filled 2024-01-27: qty 2

## 2024-01-27 MED ORDER — OXYCODONE HCL 5 MG/5ML PO SOLN: 5.0000 mg | Freq: Once | ORAL | Status: AC | PRN

## 2024-01-27 MED ORDER — OXYCODONE HCL 5 MG PO TABS
5.0000 mg | ORAL_TABLET | Freq: Once | ORAL | Status: AC | PRN
  Administered 2024-01-27: 13:00:00 5 mg via ORAL

## 2024-01-27 MED ORDER — LACTATED RINGERS IV SOLN: INTRAVENOUS | Status: DC

## 2024-01-27 MED ORDER — ONDANSETRON HCL 4 MG/2ML IJ SOLN: 4.0000 mg | Freq: Once | INTRAMUSCULAR | Status: DC | PRN

## 2024-01-27 MED ORDER — ORAL CARE MOUTH RINSE: 15.0000 mL | Freq: Once | OROMUCOSAL | Status: AC

## 2024-01-27 MED ORDER — PHENYLEPHRINE HCL-NACL 20-0.9 MG/250ML-% IV SOLN
INTRAVENOUS | Status: DC | PRN
  Administered 2024-01-27: 10:00:00 40 ug/min via INTRAVENOUS

## 2024-01-27 MED ORDER — DEXAMETHASONE SOD PHOSPHATE PF 10 MG/ML IJ SOLN: 8.0000 mg | Freq: Once | INTRAMUSCULAR | Status: DC

## 2024-01-27 MED ORDER — ONDANSETRON HCL 4 MG/2ML IJ SOLN
INTRAMUSCULAR | Status: DC | PRN
  Administered 2024-01-27: 10:00:00 4 mg via INTRAVENOUS

## 2024-01-27 MED ORDER — FENTANYL CITRATE (PF) 50 MCG/ML IJ SOSY
25.0000 ug | PREFILLED_SYRINGE | INTRAMUSCULAR | Status: DC | PRN
  Administered 2024-01-27: 12:00:00 50 ug via INTRAVENOUS

## 2024-01-27 MED ORDER — METHOCARBAMOL 500 MG PO TABS
500.0000 mg | ORAL_TABLET | Freq: Four times a day (QID) | ORAL | 0 refills | Status: DC | PRN
  Filled 2024-01-27: qty 40, 10d supply, fill #0

## 2024-01-27 MED ORDER — WATER FOR IRRIGATION, STERILE IR SOLN
Status: DC | PRN
  Administered 2024-01-27: 10:00:00 2000 mL

## 2024-01-27 MED ORDER — ONDANSETRON HCL 4 MG PO TABS: 4.0000 mg | ORAL_TABLET | Freq: Four times a day (QID) | ORAL | 0 refills | Status: AC | PRN

## 2024-01-27 MED ORDER — METHOCARBAMOL 500 MG PO TABS
ORAL_TABLET | ORAL | Status: AC
  Filled 2024-01-27: qty 1

## 2024-01-27 MED ORDER — CHLORHEXIDINE GLUCONATE 0.12 % MT SOLN
15.0000 mL | Freq: Once | OROMUCOSAL | Status: AC
  Administered 2024-01-27: 08:00:00 15 mL via OROMUCOSAL

## 2024-01-27 MED ORDER — FENTANYL CITRATE (PF) 50 MCG/ML IJ SOSY
PREFILLED_SYRINGE | INTRAMUSCULAR | Status: AC
  Filled 2024-01-27: qty 1

## 2024-01-27 MED ORDER — MEPIVACAINE HCL (PF) 2 % IJ SOLN
INTRAMUSCULAR | Status: DC | PRN
  Administered 2024-01-27: 10:00:00 3 mL via EPIDURAL

## 2024-01-27 MED ORDER — OXYCODONE HCL 5 MG PO TABS
5.0000 mg | ORAL_TABLET | ORAL | 0 refills | Status: AC | PRN
  Filled 2024-01-27: qty 42, 7d supply, fill #0

## 2024-01-27 MED ORDER — SODIUM CHLORIDE 0.9 % IV BOLUS
250.0000 mL | Freq: Once | INTRAVENOUS | Status: AC
  Administered 2024-01-27: 12:00:00 250 mL via INTRAVENOUS

## 2024-01-27 MED ORDER — TRANEXAMIC ACID-NACL 1000-0.7 MG/100ML-% IV SOLN
1000.0000 mg | INTRAVENOUS | Status: AC
  Administered 2024-01-27: 10:00:00 1000 mg via INTRAVENOUS
  Filled 2024-01-27: qty 100

## 2024-01-27 MED ORDER — EPHEDRINE 5 MG/ML INJ
INTRAVENOUS | Status: AC
  Filled 2024-01-27: qty 5

## 2024-01-27 MED ORDER — PROPOFOL 1000 MG/100ML IV EMUL
INTRAVENOUS | Status: AC
  Filled 2024-01-27: qty 100

## 2024-01-27 MED ORDER — ONDANSETRON HCL 4 MG PO TABS
4.0000 mg | ORAL_TABLET | Freq: Three times a day (TID) | ORAL | 0 refills | Status: AC | PRN
  Filled 2024-01-27: qty 20, 7d supply, fill #0

## 2024-01-27 MED ORDER — ASPIRIN 81 MG PO TBEC
81.0000 mg | DELAYED_RELEASE_TABLET | Freq: Two times a day (BID) | ORAL | 0 refills | Status: AC
  Filled 2024-01-27: qty 63, 32d supply, fill #0

## 2024-01-27 MED ORDER — OXYCODONE HCL 5 MG PO TABS
ORAL_TABLET | ORAL | Status: AC
  Filled 2024-01-27: qty 1

## 2024-01-27 MED ORDER — HYDROCODONE-ACETAMINOPHEN 7.5-325 MG PO TABS
ORAL_TABLET | ORAL | Status: AC
  Filled 2024-01-27: qty 1

## 2024-01-27 MED ORDER — 0.9 % SODIUM CHLORIDE (POUR BTL) OPTIME
TOPICAL | Status: DC | PRN
  Administered 2024-01-27: 10:00:00 1000 mL

## 2024-01-27 MED ORDER — PROPOFOL 10 MG/ML IV BOLUS
INTRAVENOUS | Status: DC | PRN
  Administered 2024-01-27 (×2): 20 mg via INTRAVENOUS

## 2024-01-27 MED ORDER — METHOCARBAMOL 500 MG PO TABS
500.0000 mg | ORAL_TABLET | Freq: Four times a day (QID) | ORAL | Status: DC | PRN
  Administered 2024-01-27: 13:00:00 500 mg via ORAL

## 2024-01-27 MED ORDER — ONDANSETRON HCL 4 MG/2ML IJ SOLN
INTRAMUSCULAR | Status: AC
  Filled 2024-01-27: qty 2

## 2024-01-27 MED ORDER — ACETAMINOPHEN 10 MG/ML IV SOLN
1000.0000 mg | Freq: Four times a day (QID) | INTRAVENOUS | Status: DC
  Administered 2024-01-27: 10:00:00 1000 mg via INTRAVENOUS
  Filled 2024-01-27: qty 100

## 2024-01-27 MED ORDER — EPHEDRINE SULFATE (PRESSORS) 25 MG/5ML IV SOSY
PREFILLED_SYRINGE | INTRAVENOUS | Status: DC | PRN
  Administered 2024-01-27: 11:00:00 5 mg via INTRAVENOUS

## 2024-01-27 MED ORDER — SODIUM CHLORIDE 0.9 % IV BOLUS: 250.0000 mL | Freq: Once | INTRAVENOUS | Status: DC

## 2024-01-27 MED ADMIN — Propofol IV Emul 500 MG/50ML (10 MG/ML): 85 ug/kg/min | INTRAVENOUS | @ 10:00:00 | NDC 00069023420

## 2024-01-27 MED ADMIN — Dexamethasone Sod Phosphate Preservative Free Inj 10 MG/ML: 10 mg | INTRAVENOUS | @ 10:00:00 | NDC 25021005301

## 2024-01-27 MED FILL — henylephrine-NaCl Pref Syr 0.8 MG/10ML-0.9% (80 MCG/ML): INTRAVENOUS | Qty: 10 | Status: AC

## 2024-01-27 SURGICAL SUPPLY — 35 items
BAG COUNTER SPONGE SURGICOUNT (BAG) IMPLANT
BAG ZIPLOCK 12X15 (MISCELLANEOUS) IMPLANT
BALL HIP CERAMIC (Hips) IMPLANT
BLADE SAG 18X100X1.27 (BLADE) ×1 IMPLANT
COVER PERINEAL POST (MISCELLANEOUS) ×1 IMPLANT
COVER SURGICAL LIGHT HANDLE (MISCELLANEOUS) ×1 IMPLANT
CUP ACET PINNACLE SECTR 50MM (Hips) IMPLANT
DERMABOND ADVANCED .7 DNX12 (GAUZE/BANDAGES/DRESSINGS) ×1 IMPLANT
DRAPE FOOT SWITCH (DRAPES) ×1 IMPLANT
DRAPE STERI IOBAN 125X83 (DRAPES) ×1 IMPLANT
DRAPE U-SHAPE 47X51 STRL (DRAPES) ×2 IMPLANT
DRSG AQUACEL AG ADV 3.5X10 (GAUZE/BANDAGES/DRESSINGS) ×1 IMPLANT
DURAPREP 26ML APPLICATOR (WOUND CARE) ×1 IMPLANT
ELECT REM PT RETURN 15FT ADLT (MISCELLANEOUS) ×1 IMPLANT
GLOVE BIO SURGEON STRL SZ 6.5 (GLOVE) IMPLANT
GLOVE BIO SURGEON STRL SZ7 (GLOVE) IMPLANT
GLOVE BIO SURGEON STRL SZ8 (GLOVE) ×1 IMPLANT
GLOVE BIOGEL PI IND STRL 7.0 (GLOVE) IMPLANT
GLOVE BIOGEL PI IND STRL 8 (GLOVE) ×1 IMPLANT
GOWN STRL REUS W/ TWL LRG LVL3 (GOWN DISPOSABLE) ×2 IMPLANT
HOLDER FOLEY CATH W/STRAP (MISCELLANEOUS) ×1 IMPLANT
KIT TURNOVER KIT A (KITS) ×1 IMPLANT
LINER ACET PNNCL PLUS4 NEUTRAL (Hips) IMPLANT
MANIFOLD NEPTUNE II (INSTRUMENTS) ×1 IMPLANT
PACK ANTERIOR HIP CUSTOM (KITS) ×1 IMPLANT
PENCIL SMOKE EVACUATOR COATED (MISCELLANEOUS) ×1 IMPLANT
SPIKE FLUID TRANSFER (MISCELLANEOUS) ×1 IMPLANT
STEM FEMORAL SZ 5MM STD ACTIS (Stem) IMPLANT
SUT ETHIBOND NAB CT1 #1 30IN (SUTURE) ×1 IMPLANT
SUT MNCRL AB 4-0 PS2 18 (SUTURE) ×1 IMPLANT
SUT VIC AB 2-0 CT1 TAPERPNT 27 (SUTURE) ×2 IMPLANT
SUTURE STRATFX 0 PDS 27 VIOLET (SUTURE) ×1 IMPLANT
TOWEL GREEN STERILE FF (TOWEL DISPOSABLE) ×1 IMPLANT
TRAY FOLEY MTR SLVR 16FR STAT (SET/KITS/TRAYS/PACK) ×1 IMPLANT
TUBE SUCTION HIGH CAP CLEAR NV (SUCTIONS) ×1 IMPLANT

## 2024-01-27 NOTE — Anesthesia Preprocedure Evaluation (Signed)
 Anesthesia Evaluation  Patient identified by MRN, date of birth, ID band Patient awake    Reviewed: Allergy & Precautions, NPO status , Patient's Chart, lab work & pertinent test results, reviewed documented beta blocker date and time   History of Anesthesia Complications Negative for: history of anesthetic complications  Airway Mallampati: III  TM Distance: >3 FB     Dental no notable dental hx.    Pulmonary neg COPD   breath sounds clear to auscultation       Cardiovascular (-) angina (-) CAD, (-) Past MI and (-) Cardiac Stents  Rhythm:Regular Rate:Normal     Neuro/Psych neg Seizures PSYCHIATRIC DISORDERS  Depression       GI/Hepatic ,neg GERD  ,,(+) neg Cirrhosis        Endo/Other    Renal/GU Renal disease     Musculoskeletal  (+) Arthritis , Osteoarthritis,    Abdominal   Peds  Hematology   Anesthesia Other Findings   Reproductive/Obstetrics                              Anesthesia Physical Anesthesia Plan  ASA: 2  Anesthesia Plan: Spinal   Post-op Pain Management: Regional block*   Induction: Intravenous  PONV Risk Score and Plan: 2 and Ondansetron  and Propofol  infusion  Airway Management Planned: Natural Airway and Simple Face Mask  Additional Equipment:   Intra-op Plan:   Post-operative Plan:   Informed Consent: I have reviewed the patients History and Physical, chart, labs and discussed the procedure including the risks, benefits and alternatives for the proposed anesthesia with the patient or authorized representative who has indicated his/her understanding and acceptance.     Dental advisory given  Plan Discussed with: CRNA  Anesthesia Plan Comments:          Anesthesia Quick Evaluation

## 2024-01-27 NOTE — Evaluation (Signed)
 Physical Therapy Evaluation Patient Details Name: Breanna Werner MRN: [id number] DOB: July 24, 1965 Today's Date: 01/27/2024  History of Present Illness  58 yo female presents to therapy s/p R THA, anterior approach on 01/27/2024 due to failure of conservative measures. Pt PMH includes but is not limited to: arthritis, depression and L THA, anterior approach on 09/17/2022 .  Clinical Impression      Breanna Werner is a 58 y.o. female POD 0 s/p R THA. Patient reports IND with mobility at baseline. Patient is now limited by functional impairments (see PT problem list below) and requires CGA and min cues for transfers and gait with RW. Patient was able to ambulate 55 and 40 feet  with RW and CGA progressing to S and cues for safe walker management. Patient educated on safe sequencing for stair mobility with SPC and HHA, fall risk prevention, use of RW, pain management and goal, CP/ice, car transfers pt and spouse verbalized and demonstrated understanding of safe guarding position for people assisting with mobility. Patient instructed in exercises to facilitate ROM and circulation reviewed and provided. Patient will benefit from continued skilled PT interventions to address impairments and progress towards PLOF. Patient has met mobility goals at adequate level for discharge home with family support and HEP; will continue to follow if pt continues acute stay to progress towards Mod I goals.     If plan is discharge home, recommend the following: A little help with walking and/or transfers;A little help with bathing/dressing/bathroom;Assistance with cooking/housework;Help with stairs or ramp for entrance;Assist for transportation   Can travel by private vehicle        Equipment Recommendations None recommended by PT  Recommendations for Other Services       Functional Status Assessment Patient has had a recent decline in their functional status and demonstrates the ability to make significant improvements  in function in a reasonable and predictable amount of time.     Precautions / Restrictions Precautions Precautions: Fall Restrictions Weight Bearing Restrictions Per Provider Order: No      Mobility  Bed Mobility Overal bed mobility: Needs Assistance Bed Mobility: Supine to Sit     Supine to sit: Supervision     General bed mobility comments: min cues    Transfers Overall transfer level: Needs assistance Equipment used: Rolling walker (2 wheels) Transfers: Sit to/from Stand Sit to Stand: Contact guard assist           General transfer comment: min cues bed, recliner and commode    Ambulation/Gait Ambulation/Gait assistance: Contact guard assist Gait Distance (Feet): 55 Feet Assistive device: Rolling walker (2 wheels) Gait Pattern/deviations: Step-to pattern, Decreased stance time - right, Antalgic, Trunk flexed Gait velocity: decreased     General Gait Details: slight trunk flexion with B UE support at RW to offload R LE in stance phase, min cues for safety, sequencing and step to pattern as well as RW management  Stairs Stairs: Yes Stairs assistance: Contact guard assist Stair Management: Two rails, With cane Number of Stairs: 2 General stair comments: step navigation initiated with B handrails and progressed to L UE with SPC and R UE support with HHA from spouse, pt and spouse verbalize understanding and performed return demonstration with step navigation, safety and technique  Wheelchair Mobility     Tilt Bed    Modified Rankin (Stroke Patients Only)       Balance Overall balance assessment: Needs assistance Sitting-balance support: Feet supported Sitting balance-Leahy Scale: Good     Standing  balance support: Bilateral upper extremity supported, During functional activity, Reliant on assistive device for balance Standing balance-Leahy Scale: Fair Standing balance comment: satic standing No UE support                              Pertinent Vitals/Pain Pain Assessment Pain Assessment: 0-10 Pain Score: 4  Pain Location: R LE and hip Pain Descriptors / Indicators: Aching, Constant, Discomfort, Dull, Operative site guarding Pain Intervention(s): Limited activity within patient's tolerance, Monitored during session, Premedicated before session, Repositioned, Ice applied    Home Living Family/patient expects to be discharged to:: Private residence Living Arrangements: Spouse/significant other Available Help at Discharge: Family Type of Home: House Home Access: Stairs to enter Entrance Stairs-Rails: None Entrance Stairs-Number of Steps: 3   Home Layout: One level Home Equipment: [employer] (2 wheels);Cane - single point      Prior Function Prior Level of Function : Independent/Modified Independent;Driving;Working/employed             Mobility Comments: IND no AD with all ADLs, self care tasks and IADL       Extremity/Trunk Assessment        Lower Extremity Assessment Lower Extremity Assessment: RLE deficits/detail RLE Deficits / Details: ankle DF/PF 5/5 RLE Sensation: WNL    Cervical / Trunk Assessment Cervical / Trunk Assessment: Normal  Communication   Communication Communication: No apparent difficulties    Cognition Arousal: Alert Behavior During Therapy: WFL for tasks assessed/performed   PT - Cognitive impairments: No apparent impairments                         Following commands: Intact       Cueing       General Comments      Exercises Total Joint Exercises Ankle Circles/Pumps: AROM, Both, 10 reps Quad Sets: AROM, Right, 5 reps Heel Slides: AROM, Right, 5 reps Hip ABduction/ADduction: AROM, Right, Standing, 5 reps Knee Flexion: AROM, Right, 5 reps, Standing Standing Hip Extension: AROM, Right, 5 reps, Standing   Assessment/Plan    PT Assessment Patient needs continued PT services  PT Problem List Decreased strength;Decreased range of  motion;Decreased activity tolerance;Decreased balance;Decreased mobility;Pain       PT Treatment Interventions DME instruction;Gait training;Stair training;Functional mobility training;Therapeutic activities;Therapeutic exercise;Balance training;Neuromuscular re-education;Modalities;Patient/family education    PT Goals (Current goals can be found in the Care Plan section)  Acute Rehab PT Goals Patient Stated Goal: walk more, travel PT Goal Formulation: With patient Time For Goal Achievement: 02/10/24 Potential to Achieve Goals: Good    Frequency 7X/week     Co-evaluation               AM-PAC PT 6 Clicks Mobility  Outcome Measure Help needed turning from your back to your side while in a flat bed without using bedrails?: None Help needed moving from lying on your back to sitting on the side of a flat bed without using bedrails?: None Help needed moving to and from a bed to a chair (including a wheelchair)?: A Little Help needed standing up from a chair using your arms (e.g., wheelchair or bedside chair)?: A Little Help needed to walk in hospital room?: A Little Help needed climbing 3-5 steps with a railing? : A Little 6 Click Score: 20    End of Session Equipment Utilized During Treatment: Gait belt Activity Tolerance: Patient tolerated treatment well Patient left: in chair;with call bell/phone  within reach;with family/visitor present Nurse Communication: Mobility status;Other (comment) (pt readiness for same day d/c from PT standpoint) PT Visit Diagnosis: Unsteadiness on feet (R26.81);Repeated falls (R29.6);Muscle weakness (generalized) (M62.81);Difficulty in walking, not elsewhere classified (R26.2);Pain Pain - Right/Left: Right Pain - part of body: Hip;Leg    Time: 1437-1530 PT Time Calculation (min) (ACUTE ONLY): 53 min   Charges:   PT Evaluation $PT Eval Low Complexity: 1 Low PT Treatments $Gait Training: 8-22 mins $Therapeutic Exercise: 8-22  mins $Therapeutic Activity: 8-22 mins PT General Charges $$ ACUTE PT VISIT: 1 Visit         [place], PT Acute Rehab   [place] VEAR Drone 01/27/2024, 3:49 PM

## 2024-01-27 NOTE — Anesthesia Procedure Notes (Signed)
 Spinal  Patient location during procedure: OR Start time: 01/27/2024 9:42 AM End time: 01/27/2024 9:45 AM Reason for block: surgical anesthesia  Staffing Performed: anesthesiologist  Authorized by: Keneth Lynwood POUR, MD   Performed by: Keneth Lynwood POUR, MD  Preanesthetic Checklist Completed: patient identified, IV checked, site marked, risks and benefits discussed, surgical consent, monitors and equipment checked, pre-op evaluation and timeout performed Spinal Block Patient position: sitting Prep: DuraPrep Patient monitoring: heart rate, cardiac monitor, continuous pulse ox and blood pressure Approach: midline Location: L3-4 Injection technique: single-shot Needle Needle type: Sprotte  Needle gauge: 24 G Needle length: 9 cm Assessment Sensory level: T4 Events: CSF return

## 2024-01-27 NOTE — Discharge Instructions (Addendum)
Frank Aluisio, MD Total Joint Specialist [facility] [address] [place], [place] [address] [phone]  ANTERIOR APPROACH TOTAL HIP REPLACEMENT POSTOPERATIVE DIRECTIONS     Hip Rehabilitation, Guidelines Following Surgery  The results of a hip operation are greatly improved after range of motion and muscle strengthening exercises. Follow all safety measures which are given to protect your hip. If any of these exercises cause increased pain or swelling in your joint, decrease the amount until you are comfortable again. Then slowly increase the exercises. Call your caregiver if you have problems or questions.   BLOOD CLOT PREVENTION Take an 81 mg Aspirin two times a day for three weeks following surgery. Then take an 81 mg Aspirin once a day for three weeks. Then discontinue Aspirin. You may resume your vitamins/supplements upon discharge from the hospital. Do not take any NSAIDs (Advil, Aleve, Ibuprofen, Meloxicam, etc.) until you are 3 weeks out from surgery  HOME CARE INSTRUCTIONS  Remove items at home which could result in a fall. This includes throw rugs or furniture in walking pathways.  ICE to the affected hip as frequently as 20-30 minutes an hour and then as needed for pain and swelling. Continue to use ice on the hip for pain and swelling from surgery. You may notice swelling that will progress down to the foot and ankle. This is normal after surgery. Elevate the leg when you are not up walking on it.   Continue to use the breathing machine which will help keep your temperature down.  It is common for your temperature to cycle up and down following surgery, especially at night when you are not up moving around and exerting yourself.  The breathing machine keeps your lungs expanded and your temperature down.  DIET You may resume your previous home diet once your are discharged from the hospital.  DRESSING / WOUND CARE / SHOWERING You have an  adhesive waterproof bandage over the incision. Leave this in place until your first follow-up appointment. Once you remove this you will not need to place another bandage.  You may begin showering 3 days following surgery, but do not submerge the incision under water.  ACTIVITY For the first 3-5 days, it is important to rest and keep the operative leg elevated. You should, as a general rule, rest for 50 minutes and walk/stretch for 10 minutes per hour. After 5 days, you may slowly increase activity as tolerated.  Perform the exercises you were provided twice a day for about 15-20 minutes each session. Begin these 2 days following surgery. Walk with your walker as instructed. Use the walker until you are comfortable transitioning to a cane. Walk with the cane in the opposite hand of the operative leg. You may discontinue the cane once you are comfortable and walking steadily. Avoid periods of inactivity such as sitting longer than an hour when not asleep. This helps prevent blood clots.  Do not drive a car for 6 weeks or until released by your surgeon.  Do not drive while taking narcotics.  TED HOSE STOCKINGS Wear the elastic stockings on both legs for three weeks following surgery during the day. You may remove them at night while sleeping.  WEIGHT BEARING Weight bearing as tolerated with assist device (walker, cane, etc) as directed, use it as long as suggested by your surgeon or therapist, typically at least 4-6 weeks.  POSTOPERATIVE CONSTIPATION PROTOCOL Constipation - defined medically as fewer than three stools per week and severe constipation as   less than one stool per week.  One of the most common issues patients have following surgery is constipation.  Even if you have a regular bowel pattern at home, your normal regimen is likely to be disrupted due to multiple reasons following surgery.  Combination of anesthesia, postoperative narcotics, change in appetite and fluid intake all can  affect your bowels.  In order to avoid complications following surgery, here are some recommendations in order to help you during your recovery period.  Colace (docusate) - Pick up an over-the-counter form of Colace or another stool softener and take twice a day as long as you are requiring postoperative pain medications.  Take with a full glass of water daily.  If you experience loose stools or diarrhea, hold the colace until you stool forms back up.  If your symptoms do not get better within 1 week or if they get worse, check with your doctor. Dulcolax (bisacodyl) - Pick up over-the-counter and take as directed by the product packaging as needed to assist with the movement of your bowels.  Take with a full glass of water.  Use this product as needed if not relieved by Colace only.  MiraLax (polyethylene glycol) - Pick up over-the-counter to have on hand.  MiraLax is a solution that will increase the amount of water in your bowels to assist with bowel movements.  Take as directed and can mix with a glass of water, juice, soda, coffee, or tea.  Take if you go more than two days without a movement.Do not use MiraLax more than once per day. Call your doctor if you are still constipated or irregular after using this medication for 7 days in a row.  If you continue to have problems with postoperative constipation, please contact the office for further assistance and recommendations.  If you experience "the worst abdominal pain ever" or develop nausea or vomiting, please contact the office immediatly for further recommendations for treatment.  ITCHING  If you experience itching with your medications, try taking only a single pain pill, or even half a pain pill at a time.  You can also use Benadryl over the counter for itching or also to help with sleep.   MEDICATIONS See your medication summary on the "After Visit Summary" that the nursing staff will review with you prior to discharge.  You may have some home  medications which will be placed on hold until you complete the course of blood thinner medication.  It is important for you to complete the blood thinner medication as prescribed by your surgeon.  Continue your approved medications as instructed at time of discharge.  PRECAUTIONS If you experience chest pain or shortness of breath - call 911 immediately for transfer to the hospital emergency department.  If you develop a fever greater that 101 F, purulent drainage from wound, increased redness or drainage from wound, foul odor from the wound/dressing, or calf pain - CONTACT YOUR SURGEON.                                                   FOLLOW-UP APPOINTMENTS Make sure you keep all of your appointments after your operation with your surgeon and caregivers. You should call the office at the above phone number and make an appointment for approximately two weeks after the date of your surgery or on the   date instructed by your surgeon outlined in the "After Visit Summary".  RANGE OF MOTION AND STRENGTHENING EXERCISES  These exercises are designed to help you keep full movement of your hip joint. Follow your caregiver's or physical therapist's instructions. Perform all exercises about fifteen times, three times per day or as directed. Exercise both hips, even if you have had only one joint replacement. These exercises can be done on a training (exercise) mat, on the floor, on a table or on a bed. Use whatever works the best and is most comfortable for you. Use music or television while you are exercising so that the exercises are a pleasant break in your day. This will make your life better with the exercises acting as a break in routine you can look forward to.  Lying on your back, slowly slide your foot toward your buttocks, raising your knee up off the floor. Then slowly slide your foot back down until your leg is straight again.  Lying on your back spread your legs as far apart as you can without causing  discomfort.  Lying on your side, raise your upper leg and foot straight up from the floor as far as is comfortable. Slowly lower the leg and repeat.  Lying on your back, tighten up the muscle in the front of your thigh (quadriceps muscles). You can do this by keeping your leg straight and trying to raise your heel off the floor. This helps strengthen the largest muscle supporting your knee.  Lying on your back, tighten up the muscles of your buttocks both with the legs straight and with the knee bent at a comfortable angle while keeping your heel on the floor.   POST-OPERATIVE OPIOID TAPER INSTRUCTIONS: It is important to wean off of your opioid medication as soon as possible. If you do not need pain medication after your surgery it is ok to stop day one. Opioids include: Codeine, Hydrocodone(Norco, Vicodin), Oxycodone(Percocet, oxycontin) and hydromorphone amongst others.  Long term and even short term use of opiods can cause: Increased pain response Dependence Constipation Depression Respiratory depression And more.  Withdrawal symptoms can include Flu like symptoms Nausea, vomiting And more Techniques to manage these symptoms Hydrate well Eat regular healthy meals Stay active Use relaxation techniques(deep breathing, meditating, yoga) Do Not substitute Alcohol to help with tapering If you have been on opioids for less than two weeks and do not have pain than it is ok to stop all together.  Plan to wean off of opioids This plan should start within one week post op of your joint replacement. Maintain the same interval or time between taking each dose and first decrease the dose.  Cut the total daily intake of opioids by one tablet each day Next start to increase the time between doses. The last dose that should be eliminated is the evening dose.   IF YOU ARE TRANSFERRED TO A SKILLED REHAB FACILITY If the patient is transferred to a skilled rehab facility following release from the  hospital, a list of the current medications will be sent to the facility for the patient to continue.  When discharged from the skilled rehab facility, please have the facility set up the patient's Home Health Physical Therapy prior to being released. Also, the skilled facility will be responsible for providing the patient with their medications at time of release from the facility to include their pain medication, the muscle relaxants, and their blood thinner medication. If the patient is still at the rehab facility   at time of the two week follow up appointment, the skilled rehab facility will also need to assist the patient in arranging follow up appointment in our office and any transportation needs.  MAKE SURE YOU:  Understand these instructions.  Get help right away if you are not doing well or get worse.    DENTAL ANTIBIOTICS:  In most cases prophylactic antibiotics for Dental procdeures after total joint surgery are not necessary.  Exceptions are as follows:  1. History of prior total joint infection  2. Severely immunocompromised (Organ Transplant, cancer chemotherapy, Rheumatoid biologic meds such as Humera)  3. Poorly controlled diabetes (A1C &gt; 8.0, blood glucose over 200)  If you have one of these conditions, contact your surgeon for an antibiotic prescription, prior to your dental procedure.    Pick up stool softner and laxative for home use following surgery while on pain medications. Do not submerge incision under water. Please use good hand washing techniques while changing dressing each day. May shower starting three days after surgery. Please use a clean towel to pat the incision dry following showers. Continue to use ice for pain and swelling after surgery. Do not use any lotions or creams on the incision until instructed by your surgeon.  

## 2024-01-27 NOTE — Op Note (Signed)
 OPERATIVE REPORT- TOTAL HIP ARTHROPLASTY   PREOPERATIVE DIAGNOSIS: Osteoarthritis of the Right hip.   POSTOPERATIVE DIAGNOSIS: Osteoarthritis of the Right  hip.   PROCEDURE: Right total hip arthroplasty, anterior approach.   SURGEON: Dempsey Moan, MD   ASSISTANT: Waddell Sor, PA-C  ANESTHESIA:  Spinal  ESTIMATED BLOOD LOSS:-150 mL    DRAINS: None  COMPLICATIONS: None   CONDITION: PACU - hemodynamically stable.   BRIEF CLINICAL NOTE: Breanna Werner is a 58 y.o. female who has advanced end-  stage arthritis of their Right  hip with progressively worsening pain and  dysfunction.The patient has failed nonoperative management and presents for  total hip arthroplasty.   PROCEDURE IN DETAIL: After successful administration of spinal  anesthetic, the traction boots for the [facility] bed were placed on both  feet and the patient was placed onto the [facility] bed, boots placed into the leg  holders. The Right hip was then isolated from the perineum with plastic  drapes and prepped and draped in the usual sterile fashion. ASIS and  greater trochanter were marked and a oblique incision was made, starting  at about 1 cm lateral and 2 cm distal to the ASIS and coursing towards  the anterior cortex of the femur. The skin was cut with a 10 blade  through subcutaneous tissue to the level of the fascia overlying the  tensor fascia lata muscle. The fascia was then incised in line with the  incision at the junction of the anterior third and posterior 2/3rd. The  muscle was teased off the fascia and then the interval between the TFL  and the rectus was developed. The Hohmann retractor was then placed at  the top of the femoral neck over the capsule. The vessels overlying the  capsule were cauterized and the fat on top of the capsule was removed.  A Hohmann retractor was then placed anterior underneath the rectus  femoris to give exposure to the entire anterior capsule. A T-shaped  capsulotomy  was performed. The edges were tagged and the femoral head  was identified.       Osteophytes are removed off the superior acetabulum.  The femoral neck was then cut in situ with an oscillating saw. Traction  was then applied to the left lower extremity utilizing the [facility]  traction. The femoral head was then removed. Retractors were placed  around the acetabulum and then circumferential removal of the labrum was  performed. Osteophytes were also removed. Reaming starts at 47 mm to  medialize and  Increased in 2 mm increments to 49 mm. We reamed in  approximately 40 degrees of abduction, 20 degrees anteversion. A 50 mm  pinnacle acetabular shell was then impacted in anatomic position under  fluoroscopic guidance with excellent purchase. We did not need to place  any additional dome screws. A 32 mm neutral + 4 Altrx  liner was then  placed into the acetabular shell.       The femoral lift was then placed along the lateral aspect of the femur  just distal to the vastus ridge. The leg was  externally rotated and capsule  was stripped off the inferior aspect of the femoral neck down to the  level of the lesser trochanter, this was done with electrocautery. The femur was lifted after this was performed. The  leg was then placed in an extended and adducted position essentially delivering the femur. We also removed the capsule superiorly and the piriformis from the piriformis fossa  to gain excellent exposure of the  proximal femur. Rongeur was used to remove some cancellous bone to get  into the lateral portion of the proximal femur for placement of the  initial starter reamer. The starter broaches was placed  the starter broach  and was shown to go down the center of the canal. Broaching  with the Actis system was then performed starting at size 0  coursing  Up to size 5. A size 5 had excellent torsional and rotational  and axial stability. The trial standard offset neck was then placed  with a 32 +  5 trial head. The hip was then reduced. We confirmed that  the stem was in the canal both on AP and lateral x-rays. It also has excellent sizing. The hip was reduced with outstanding stability through full extension and full external rotation.. AP pelvis was taken and the leg lengths were measured and found to be equal. Hip was then dislocated again and the femoral head and neck removed. The  femoral broach was removed. Size 5 Actis stem with a standard offset  neck was then impacted into the femur following native anteversion. Has  excellent purchase in the canal. Excellent torsional and rotational and  axial stability. It is confirmed to be in the canal on AP and lateral  fluoroscopic views. The 32 + 5 ceramic head was placed and the hip  reduced with outstanding stability. Again AP pelvis was taken and it  confirmed that the leg lengths were equal. The wound was then copiously  irrigated with saline solution and the capsule reattached and repaired  with Ethibond suture. 30 ml of .25% Bupivicaine was  injected into the capsule and into the edge of the tensor fascia lata as well as subcutaneous tissue. The fascia overlying the tensor fascia lata was then closed with a running #1 V-Loc. Subcu was closed with interrupted 2-0 Vicryl and subcuticular running 4-0 Monocryl. Incision was cleaned  and dried. Steri-Strips and a bulky sterile dressing applied. The patient was awakened and transported to  recovery in stable condition.        Please note that a surgical assistant was a medical necessity for this procedure to perform it in a safe and expeditious manner. Assistant was necessary to provide appropriate retraction of vital neurovascular structures and to prevent femoral fracture and allow for anatomic placement of the prosthesis.  Dempsey Moan, M.D.

## 2024-01-27 NOTE — Interval H&P Note (Signed)
 History and Physical Interval Note:  01/27/2024 8:08 AM  Breanna Werner  has presented today for surgery, with the diagnosis of Right hip osteoarhritis.  The various methods of treatment have been discussed with the patient and family. After consideration of risks, benefits and other options for treatment, the patient has consented to  Procedures: ARTHROPLASTY, HIP, TOTAL, ANTERIOR APPROACH (Right) as a surgical intervention.  The patient's history has been reviewed, patient examined, no change in status, stable for surgery.  I have reviewed the patient's chart and labs.  Questions were answered to the patient's satisfaction.     Dempsey Lawanda Holzheimer

## 2024-01-27 NOTE — Transfer of Care (Signed)
 Immediate Anesthesia Transfer of Care Note  Patient: Breanna Werner  Procedure(s) Performed: ARTHROPLASTY, HIP, TOTAL, ANTERIOR APPROACH (Right: Hip)  Patient Location: PACU  Anesthesia Type:MAC and Spinal  Level of Consciousness: awake, alert , oriented, and patient cooperative  Airway & Oxygen Therapy: Patient Spontanous Breathing and Patient connected to face mask oxygen  Post-op Assessment: Report given to RN and Post -op Vital signs reviewed and stable  Post vital signs: Reviewed and stable  Last Vitals:  Vitals Value Taken Time  BP 110/63 01/27/24 11:17  Temp    Pulse 55 01/27/24 11:19  Resp 13 01/27/24 11:19  SpO2 100 % 01/27/24 11:19  Vitals shown include unfiled device data.  Last Pain:  Vitals:   01/27/24 0743  TempSrc:   PainSc: 0-No pain         Complications: No notable events documented.

## 2024-01-28 ENCOUNTER — Encounter (HOSPITAL_COMMUNITY): Payer: Self-pay | Admitting: Orthopedic Surgery

## 2024-01-29 NOTE — Anesthesia Postprocedure Evaluation (Signed)
"   Anesthesia Post Note  Patient: Breanna Werner  Procedure(s) Performed: ARTHROPLASTY, HIP, TOTAL, ANTERIOR APPROACH (Right: Hip)     Patient location during evaluation: PACU Anesthesia Type: Spinal Level of consciousness: awake and alert Pain management: pain level controlled Vital Signs Assessment: post-procedure vital signs reviewed and stable Respiratory status: spontaneous breathing, nonlabored ventilation, respiratory function stable and patient connected to nasal cannula oxygen Cardiovascular status: blood pressure returned to baseline and stable Postop Assessment: no apparent nausea or vomiting Anesthetic complications: no   No notable events documented.  Last Vitals:  Vitals:   01/27/24 1400 01/27/24 1430  BP: 105/69 121/66  Pulse: 64 70  Resp:  16  Temp:    SpO2: 98% 100%    Last Pain:  Vitals:   01/27/24 1430  TempSrc:   PainSc: 7                  [place] MARLA Cornea      "

## 2024-02-10 ENCOUNTER — Other Ambulatory Visit (HOSPITAL_BASED_OUTPATIENT_CLINIC_OR_DEPARTMENT_OTHER): Payer: Self-pay

## 2024-02-10 ENCOUNTER — Other Ambulatory Visit (HOSPITAL_COMMUNITY): Payer: Self-pay

## 2024-02-10 MED ORDER — METHOCARBAMOL 500 MG PO TABS
500.0000 mg | ORAL_TABLET | Freq: Four times a day (QID) | ORAL | 0 refills | Status: AC | PRN
  Filled 2024-02-10 (×2): qty 30, 8d supply, fill #0
